# Patient Record
Sex: Male | Born: 1940
Health system: Southern US, Community
[De-identification: ages and names within clinical notes are randomized; demographics above are authoritative.]

## PROBLEM LIST (undated history)

## (undated) ENCOUNTER — Emergency Department (HOSPITAL_BASED_OUTPATIENT_CLINIC_OR_DEPARTMENT_OTHER): Admission: EM | Payer: Medicare Other | Source: Home / Self Care

## (undated) DIAGNOSIS — F1010 Alcohol abuse, uncomplicated: Secondary | ICD-10-CM

## (undated) DIAGNOSIS — F329 Major depressive disorder, single episode, unspecified: Secondary | ICD-10-CM

## (undated) DIAGNOSIS — F32A Depression, unspecified: Secondary | ICD-10-CM

## (undated) DIAGNOSIS — M1990 Unspecified osteoarthritis, unspecified site: Secondary | ICD-10-CM

## (undated) DIAGNOSIS — E785 Hyperlipidemia, unspecified: Secondary | ICD-10-CM

## (undated) DIAGNOSIS — I10 Essential (primary) hypertension: Secondary | ICD-10-CM

## (undated) DIAGNOSIS — N200 Calculus of kidney: Secondary | ICD-10-CM

## (undated) HISTORY — PX: HERNIA REPAIR: SHX51

## (undated) HISTORY — DX: Depression, unspecified: F32.A

## (undated) HISTORY — DX: Alcohol abuse, uncomplicated: F10.10

## (undated) HISTORY — DX: Major depressive disorder, single episode, unspecified: F32.9

## (undated) HISTORY — PX: COCHLEAR IMPLANT: SUR684

## (undated) HISTORY — DX: Unspecified osteoarthritis, unspecified site: M19.90

## (undated) HISTORY — DX: Essential (primary) hypertension: I10

## (undated) HISTORY — DX: Hyperlipidemia, unspecified: E78.5

## (undated) HISTORY — DX: Calculus of kidney: N20.0

---

## 1999-12-25 ENCOUNTER — Ambulatory Visit (HOSPITAL_COMMUNITY): Admission: RE | Admit: 1999-12-25 | Discharge: 1999-12-25 | Payer: Self-pay | Admitting: Gastroenterology

## 2000-08-20 ENCOUNTER — Emergency Department (HOSPITAL_COMMUNITY): Admission: EM | Admit: 2000-08-20 | Discharge: 2000-08-20 | Payer: Self-pay | Admitting: Emergency Medicine

## 2004-01-20 ENCOUNTER — Emergency Department (HOSPITAL_COMMUNITY): Admission: EM | Admit: 2004-01-20 | Discharge: 2004-01-21 | Payer: Self-pay | Admitting: Emergency Medicine

## 2006-04-12 ENCOUNTER — Ambulatory Visit (HOSPITAL_COMMUNITY): Admission: RE | Admit: 2006-04-12 | Discharge: 2006-04-12 | Payer: Self-pay | Admitting: General Surgery

## 2009-11-26 ENCOUNTER — Encounter (HOSPITAL_COMMUNITY): Admission: RE | Admit: 2009-11-26 | Discharge: 2009-12-02 | Payer: Self-pay | Admitting: Cardiology

## 2011-04-23 NOTE — Op Note (Signed)
NAME:  Michael Miranda, Michael Miranda NO.:  [id number]   MEDICAL RECORD NO.:  [id number]          PATIENT TYPE:  AMB   LOCATION:  DAY                          FACILITY:  [facility]   PHYSICIAN:  Adolph Pollack, M.D.DATE OF BIRTH:  Dec 02, 1941   DATE OF PROCEDURE:  04/12/2006  DATE OF DISCHARGE:                                 OPERATIVE REPORT   PREOPERATIVE DIAGNOSIS:  Bilateral inguinal hernias.   POSTOPERATIVE DIAGNOSIS:  Bilateral inguinal hernias.   PROCEDURE:  Laparoscopic repair of bilateral inguinal hernias with mesh.   SURGEON:  Adolph Pollack, M.D.   ANESTHESIA:  General.   INDICATIONS:  This 70 year old male known have bilateral inguinal hernias.  He initially saw Dr. Maryagnes Amos, but the patient was felt to be a good candidate  for laparoscopic repair of this.  I saw him and he presents for that today.  We discussed the procedure risks and aftercare preoperatively.   TECHNIQUE:  He was seen in holding area then voided.  He was brought to the  operating room, placed on the operating table and a general anesthetic was  administered.  The hair in the lower abdominal wall was clipped and the area  sterilely prepped and draped.  Dilute Marcaine solution was infiltrated in  the subumbilical region and a subumbilical incision was made through the  skin and subcutaneous tissue.  The left anterior rectus sheath was  identified.  The left-sided hernia was larger.  I made a small incision into  the left anterior rectus sheath and then exposed the posterior rectus  sheath.  A balloon dissection device was then placed in the extraperitoneal  space under laparoscopic vision.  Balloon dissection performed of the  extraperitoneal space.   Following this, the laparoscope was introduced and CO2 gas insufflated into  the extraperitoneal space.  There was a pneumoperitoneum noted likely from a  small tear in the peritoneum and a Veress needle was placed through the  subumbilical  incision into the peritoneal cavity.  The patient was placed in  Trendelenburg position.  Using blunt dissection, I identify the symphysis  pubis and Cooper's ligament bilaterally.  I then began on the left-sided and  there was a very large left indirect hernia sac noted.  I dissected  fibrofatty tissue away from the anterior abdominal wall anteriorly and  laterally using blunt dissection back to the level of the umbilicus.  I then  began manipulating the hernia sac and dissecting it free from the cord,  which took some time as this was a large sac.  There were some small holes  made in the sac but I was able to strip it back to the level of the  umbilicus.  The spermatic cord contents were identified and a window created  around them.   Following this, I approached the right extraperitoneal space.  I dissected  the fibrofatty tissue away from the anterior and lateral abdominal walls  bluntly.  He also had a moderate to large size indirect sac on this side and  I dissected this free from the spermatic cord back to the level of  the  umbilicus.  The direct spaces was both intact on both sizes as were the  femoral spaces.   Following this, a 5 x 6 inch piece of polypropylene mesh with a partial  longitudinal slit cut into it was then placed into left extraperitoneal  space and positioned so the two tails wrapped around the cord.  The mesh was  then anchored to Cooper's ligament, the anterior and lateral abdominal walls  with spiral tacks.  This more than adequately covered the direct, indirect  and femoral spaces with adequate overlap.   A similar stick piece of mesh measuring 5 x 6 inches long with partial  longitudinal slit cut into it was then placed in the right extraperitoneal  space with the two tails wrapped around the spermatic cord.  The mesh was  then anchored to Cooper's ligament, anterior and lateral abdominal walls  with spiral tacks.  This provided for more than adequate  coverage of overlap  of the direct, indirect and femoral spaces.   At this point, hemostasis was adequate.  I then held down the inferolateral  aspect of each of the meshes and released the CO2 gas and placed the patient  back in the neutral position.  The instruments and trocars were then  removed.   The left anterior rectus sheath fascial defect was closed with 0 Vicryl  suture.  The skin incisions were closed with 4-0 Monocryl subcuticular  stitches followed by Steri-Strips and sterile dressings.   He tolerated the procedure without any apparent complications and was taken  to recovery in satisfactory condition.      Adolph Pollack, M.D.  Electronically Signed     TJR/MEDQ  D:  04/12/2006  T:  04/13/2006  Job:  [id number]   cc:   Jethro Bastos, M.D.  Fax: [phone]

## 2011-12-27 DIAGNOSIS — G4700 Insomnia, unspecified: Secondary | ICD-10-CM | POA: Insufficient documentation

## 2011-12-27 DIAGNOSIS — E7800 Pure hypercholesterolemia, unspecified: Secondary | ICD-10-CM | POA: Insufficient documentation

## 2012-04-08 DIAGNOSIS — H903 Sensorineural hearing loss, bilateral: Secondary | ICD-10-CM | POA: Diagnosis not present

## 2012-07-10 DIAGNOSIS — H905 Unspecified sensorineural hearing loss: Secondary | ICD-10-CM | POA: Diagnosis not present

## 2012-07-10 DIAGNOSIS — Z461 Encounter for fitting and adjustment of hearing aid: Secondary | ICD-10-CM | POA: Diagnosis not present

## 2012-07-21 DIAGNOSIS — H903 Sensorineural hearing loss, bilateral: Secondary | ICD-10-CM | POA: Diagnosis not present

## 2012-08-11 ENCOUNTER — Ambulatory Visit (INDEPENDENT_AMBULATORY_CARE_PROVIDER_SITE_OTHER): Payer: Medicare Other | Admitting: Family

## 2012-08-11 ENCOUNTER — Encounter: Payer: Self-pay | Admitting: Family

## 2012-08-11 VITALS — BP 160/90 | HR 63 | Temp 98.0°F | Ht 68.0 in | Wt 139.0 lb

## 2012-08-11 DIAGNOSIS — R634 Abnormal weight loss: Secondary | ICD-10-CM

## 2012-08-11 DIAGNOSIS — G4700 Insomnia, unspecified: Secondary | ICD-10-CM | POA: Diagnosis not present

## 2012-08-11 DIAGNOSIS — R5381 Other malaise: Secondary | ICD-10-CM

## 2012-08-11 DIAGNOSIS — I10 Essential (primary) hypertension: Secondary | ICD-10-CM | POA: Diagnosis not present

## 2012-08-11 DIAGNOSIS — E785 Hyperlipidemia, unspecified: Secondary | ICD-10-CM

## 2012-08-11 DIAGNOSIS — R5383 Other fatigue: Secondary | ICD-10-CM

## 2012-08-11 LAB — TESTOSTERONE: Testosterone: 372.4 ng/dL (ref 350.00–890.00)

## 2012-08-11 LAB — CBC WITH DIFFERENTIAL/PLATELET
Basophils Relative: 0.4 % (ref 0.0–3.0)
HCT: 45.3 % (ref 39.0–52.0)
Lymphocytes Relative: 15.7 % (ref 12.0–46.0)
Neutro Abs: 4.2 10*3/uL (ref 1.4–7.7)
RBC: 4.51 Mil/uL (ref 4.22–5.81)

## 2012-08-11 LAB — TSH: TSH: 0.63 u[IU]/mL (ref 0.35–5.50)

## 2012-08-11 MED ORDER — ZOLPIDEM TARTRATE 10 MG PO TABS: 10.0000 mg | ORAL_TABLET | Freq: Every evening | ORAL | Status: DC | PRN

## 2012-08-11 NOTE — Progress Notes (Signed)
Subjective:    Patient ID: Michael Miranda, male    DOB: Jan 04, 1941, 71 y.o.   MRN: [id number]  HPI 71 year old new patient is in today to be established. He has a history of Hypertension, hyperlipidemia, and insomnia. Reports recent weigh loss 10lbs in the last 4 months and stress related to his wife recently being diagnosed with breast cancer. She is undergoing chemo. His last CPX was 3 years ago.    Review of Systems  Constitutional: Positive for appetite change and fatigue.  HENT: Negative.   Eyes: Negative.   Respiratory: Negative.   Cardiovascular: Negative.   Gastrointestinal: Negative.  Negative for blood in stool.  Genitourinary: Negative.   Musculoskeletal: Negative.   Skin: Negative.   Neurological: Negative.   Hematological: Negative.   Psychiatric/Behavioral: Positive for disturbed wake/sleep cycle. Negative for suicidal ideas and agitation. The patient is nervous/anxious.    Past Medical History  Diagnosis Date  . Hyperlipidemia   . Hypertension   . Depression   . Alcohol abuse   . Kidney stones   . Arthritis     History   Social History  . Marital Status: Married    Spouse Name: N/A    Number of Children: N/A  . Years of Education: N/A   Occupational History  . Not on file.   Social History Main Topics  . Smoking status: Never Smoker   . Smokeless tobacco: Not on file  . Alcohol Use: Yes  . Drug Use: No  . Sexually Active: Not on file   Other Topics Concern  . Not on file   Social History Narrative  . No narrative on file    Past Surgical History  Procedure Date  . Hernia repair   . Cochlear implant     bilateral    No family history on file.  No Known Allergies  Current Outpatient Prescriptions on File Prior to Visit  Medication Sig Dispense Refill  . atenolol (TENORMIN) 50 MG tablet Take 50 mg by mouth daily.       . benazepril-hydrochlorthiazide (LOTENSIN HCT) 20-12.5 MG per tablet Take 1 tablet by mouth daily.       Marland Kitchen  zolpidem (AMBIEN) 10 MG tablet Take 1 tablet (10 mg total) by mouth at bedtime as needed.  30 tablet  3    BP 160/90  Pulse 63  Temp 98 F (36.7 C) (Oral)  Ht 5\' 8"  (1.727 m)  Wt 139 lb (63.05 kg)  BMI 21.13 kg/m2  SpO2 98%chart    Objective:   Physical Exam  Constitutional: He is oriented to person, place, and time. He appears well-developed and well-nourished.  HENT:  Right Ear: External ear normal.  Left Ear: External ear normal.  Nose: Nose normal.  Mouth/Throat: Oropharynx is clear and moist.  Neck: Normal range of motion. Neck supple.  Cardiovascular: Normal rate, regular rhythm and normal heart sounds.   Pulmonary/Chest: Effort normal and breath sounds normal.  Abdominal: Soft. Bowel sounds are normal.  Musculoskeletal: Normal range of motion.  Neurological: He is alert and oriented to person, place, and time.  Skin: Skin is warm and dry.  Psychiatric: He has a normal mood and affect.          Assessment & Plan:  Assessment: Weight Loss, Acute Stress Reaction, Hypertension, Insomnia, Hyperlipidemia  Plan: Labs sent to include BMP, Lipids, CBC, TSH, testosterone level, will notify patient pending results. Encouraged a healthy diet and exercise. Start cholesterol medication if no better. Will  consider Livalo. Recheck for CPX in 1 month. Ambien renewed. Declined flu shot. Wishes to wait until next month.

## 2012-08-14 LAB — BASIC METABOLIC PANEL
CO2: 23 mEq/L (ref 19–32)
Calcium: 9.8 mg/dL (ref 8.4–10.5)
Creatinine, Ser: 1.1 mg/dL (ref 0.4–1.5)
GFR: 71.67 mL/min (ref >60.00)
Glucose, Bld: 95 mg/dL (ref 70–99)
Sodium: 140 mEq/L (ref 135–145)

## 2012-08-14 LAB — LIPID PANEL
Cholesterol: 237 mg/dL — ABNORMAL HIGH (ref 0–200)
HDL: 84.6 mg/dL (ref >39.00)
Total CHOL/HDL Ratio: 3
Triglycerides: 138 mg/dL (ref 0.0–149.0)
VLDL: 27.6 mg/dL (ref 0.0–40.0)

## 2012-09-06 ENCOUNTER — Encounter: Payer: Federal, State, Local not specified - PPO | Admitting: Family

## 2012-09-18 ENCOUNTER — Ambulatory Visit (INDEPENDENT_AMBULATORY_CARE_PROVIDER_SITE_OTHER): Payer: Medicare Other | Admitting: Family

## 2012-09-18 ENCOUNTER — Encounter: Payer: Self-pay | Admitting: Family

## 2012-09-18 VITALS — BP 130/78 | HR 59 | Temp 97.9°F | Ht 68.0 in | Wt 134.0 lb

## 2012-09-18 DIAGNOSIS — Z0000 Encounter for general adult medical examination without abnormal findings: Secondary | ICD-10-CM

## 2012-09-18 DIAGNOSIS — I10 Essential (primary) hypertension: Secondary | ICD-10-CM | POA: Diagnosis not present

## 2012-09-18 DIAGNOSIS — G4700 Insomnia, unspecified: Secondary | ICD-10-CM | POA: Diagnosis not present

## 2012-09-18 DIAGNOSIS — Z23 Encounter for immunization: Secondary | ICD-10-CM

## 2012-09-18 DIAGNOSIS — E785 Hyperlipidemia, unspecified: Secondary | ICD-10-CM | POA: Diagnosis not present

## 2012-09-18 MED ORDER — TRAZODONE HCL 50 MG PO TABS: 25.0000 mg | ORAL_TABLET | Freq: Every evening | ORAL | Status: DC | PRN

## 2012-09-18 MED ORDER — ATENOLOL 25 MG PO TABS: 25.0000 mg | ORAL_TABLET | Freq: Every day | ORAL | Status: DC

## 2012-09-18 MED ORDER — PITAVASTATIN CALCIUM 2 MG PO TABS: 1.0000 | ORAL_TABLET | Freq: Every day | ORAL | Status: DC

## 2012-09-18 NOTE — Progress Notes (Signed)
Subjective:    Patient ID: Michael Miranda, male    DOB: 07/31/1941, 71 y.o.   MRN: [id number]  HPI 71 year old non smoking white male seen for medicare exam. Pt reports feeling fatigued and having cold hand and feet in warm environments. Pt concerned that atenolol 50mg  is causing symptoms. Pt has high stress level at home due to wife treatment of breast cancer. Pt reported taking wife trazadone 50mg  at night for sleeping 3-4 nights per week with good result. Pt states that on the nights he took trazadone he did not take prescribed Ambien.  Last colonoscopy in 2010.     Review of Systems  Constitutional: Positive for fatigue (Reports feeling tired mid day and having to take a nap ).  HENT: Positive for hearing loss (Cochlear implants bilaterally).   Eyes: Negative.   Respiratory: Negative.   Cardiovascular: Negative.   Gastrointestinal: Negative.   Genitourinary: Negative.   Musculoskeletal: Negative.   Skin: Negative.   Neurological: Negative.   Hematological: Negative.   Psychiatric/Behavioral: Negative.    Past Medical History  Diagnosis Date  . Hyperlipidemia   . Hypertension   . Depression   . Alcohol abuse   . Kidney stones   . Arthritis     History   Social History  . Marital Status: Married    Spouse Name: N/A    Number of Children: N/A  . Years of Education: N/A   Occupational History  . Not on file.   Social History Main Topics  . Smoking status: Never Smoker   . Smokeless tobacco: Not on file  . Alcohol Use: Yes  . Drug Use: No  . Sexually Active: Not on file   Other Topics Concern  . Not on file   Social History Narrative  . No narrative on file    Past Surgical History  Procedure Date  . Hernia repair   . Cochlear implant     bilateral    No family history on file.  No Known Allergies  Current Outpatient Prescriptions on File Prior to Visit  Medication Sig Dispense Refill  . benazepril-hydrochlorthiazide (LOTENSIN HCT) 20-12.5 MG  per tablet Take 1 tablet by mouth daily.       Marland Kitchen zolpidem (AMBIEN) 10 MG tablet Take 1 tablet (10 mg total) by mouth at bedtime as needed.  30 tablet  3  . DISCONTD: atenolol (TENORMIN) 50 MG tablet Take 50 mg by mouth daily.       . Pitavastatin Calcium (LIVALO) 2 MG TABS Take 1 tablet (2 mg total) by mouth at bedtime.  30 tablet  3  . traZODone (DESYREL) 50 MG tablet Take 0.5-1 tablets (25-50 mg total) by mouth at bedtime as needed for sleep.  30 tablet  3    BP 130/78  Pulse 59  Temp 97.9 F (36.6 C) (Oral)  Ht 5\' 8"  (1.727 m)  Wt 134 lb (60.782 kg)  BMI 20.37 kg/m2  SpO2 99%chart    Objective:   Physical Exam  Constitutional: He is oriented to person, place, and time. He appears well-nourished.  HENT:  Head: Normocephalic.  Right Ear: External ear normal.  Left Ear: External ear normal.       Bilaterally cochlear implants  Eyes: Pupils are equal, round, and reactive to light.  Neck: Normal range of motion. Neck supple.  Cardiovascular: Normal rate, regular rhythm and normal heart sounds.   Pulmonary/Chest: Effort normal and breath sounds normal.  Abdominal: Soft. Bowel sounds are normal.  Genitourinary: Rectum normal and penis normal.  Musculoskeletal: Normal range of motion.  Neurological: He is alert and oriented to person, place, and time.  Skin: Skin is warm and dry.  Psychiatric:       anxious     EKG- Bradycardia otherwise Normal Fecal Occult- normal Labs reviewed        Assessment & Plan:  Assessment: Preventative Healthcare, Hypertension, Insomnia, Hypercholesterolmia   PLAN: Atenolol 25mg  daily Livalo 2mg  daily  Trazadone 50 mg daily at night before sleeping Influenza vaccine Next Appointment 6 weeks for Cholesetrol recheck or follow up as needed before appt.

## 2012-09-18 NOTE — Patient Instructions (Signed)

## 2012-09-27 DIAGNOSIS — Z9689 Presence of other specified functional implants: Secondary | ICD-10-CM | POA: Diagnosis not present

## 2012-09-27 DIAGNOSIS — Z462 Encounter for fitting and adjustment of other devices related to nervous system and special senses: Secondary | ICD-10-CM | POA: Diagnosis not present

## 2012-09-27 DIAGNOSIS — H905 Unspecified sensorineural hearing loss: Secondary | ICD-10-CM | POA: Diagnosis not present

## 2012-12-04 DIAGNOSIS — H21509 Unspecified adhesions of iris and ciliary body, unspecified eye: Secondary | ICD-10-CM | POA: Diagnosis not present

## 2012-12-04 DIAGNOSIS — H2510 Age-related nuclear cataract, unspecified eye: Secondary | ICD-10-CM | POA: Diagnosis not present

## 2012-12-04 DIAGNOSIS — D3190 Benign neoplasm of unspecified part of unspecified eye: Secondary | ICD-10-CM | POA: Diagnosis not present

## 2012-12-04 DIAGNOSIS — H43819 Vitreous degeneration, unspecified eye: Secondary | ICD-10-CM | POA: Diagnosis not present

## 2012-12-14 ENCOUNTER — Telehealth: Payer: Self-pay | Admitting: Family

## 2012-12-14 MED ORDER — BENAZEPRIL-HYDROCHLOROTHIAZIDE 20-12.5 MG PO TABS: 1.0000 | ORAL_TABLET | Freq: Every day | ORAL | Status: DC

## 2012-12-14 NOTE — Telephone Encounter (Signed)
Patient came in stating that he need a refill of his benazeprile/hctz 20-12.5mg  sent to [facility] on [address]. Please assist.

## 2012-12-14 NOTE — Telephone Encounter (Signed)
Rx sent 

## 2012-12-25 ENCOUNTER — Ambulatory Visit: Payer: Federal, State, Local not specified - PPO | Admitting: Family

## 2012-12-26 ENCOUNTER — Ambulatory Visit (INDEPENDENT_AMBULATORY_CARE_PROVIDER_SITE_OTHER): Payer: Medicare Other | Admitting: Family

## 2012-12-26 ENCOUNTER — Encounter: Payer: Self-pay | Admitting: Family

## 2012-12-26 VITALS — BP 126/70 | HR 63 | Wt 139.0 lb

## 2012-12-26 DIAGNOSIS — R0789 Other chest pain: Secondary | ICD-10-CM

## 2012-12-26 DIAGNOSIS — I10 Essential (primary) hypertension: Secondary | ICD-10-CM

## 2012-12-26 DIAGNOSIS — G4700 Insomnia, unspecified: Secondary | ICD-10-CM

## 2012-12-26 DIAGNOSIS — E7800 Pure hypercholesterolemia, unspecified: Secondary | ICD-10-CM | POA: Diagnosis not present

## 2012-12-26 MED ORDER — BENAZEPRIL-HYDROCHLOROTHIAZIDE 20-12.5 MG PO TABS: 1.0000 | ORAL_TABLET | Freq: Every day | ORAL | Status: DC

## 2012-12-26 MED ORDER — ZOLPIDEM TARTRATE 10 MG PO TABS: 10.0000 mg | ORAL_TABLET | Freq: Every evening | ORAL | Status: DC | PRN

## 2012-12-26 MED ORDER — ESCITALOPRAM OXALATE 10 MG PO TABS: 10.0000 mg | ORAL_TABLET | Freq: Every day | ORAL | Status: DC

## 2012-12-26 NOTE — Patient Instructions (Signed)

## 2012-12-26 NOTE — Progress Notes (Signed)
Subjective:    Patient ID: Michael Miranda, male    DOB: Apr 17, 1941, 72 y.o.   MRN: [id number]  HPI 72 year old white male, nonsmoker is in for recheck of hypertension, hyperlipidemia, insomnia, depression. He has been off his cholesterol medication for several days because it was too expensive. He previously had a status card that was helping with co-pay. Patient's wife is still battling breast cancer but appears to be doing well. Patient reports being under a lot of stress with multiple doctor's appointments in with his wife's illness. Reports have been a dull chest ache that occurs a couple times a week lasting 1-2 hours. It particularly when he's having to deal with his wife's  "issues." No pain with exertion. He had an echocardiogram 3 years ago that was normal. Rates her pain a 4/10 but eventually just subsides. Denies any lightheadedness, dizziness, shortness of breath, nausea or diaphoresis.  Patient reports have been more sadness recently. Feels like he doesn't have a life. Reports 2-3 days a week of feeling down. Denies anxiousness. Denies any feelings of helplessness, hopelessness, thoughts of death or dying.  Review of Systems  Constitutional: Negative.   Respiratory: Negative.   Cardiovascular: Negative.   Gastrointestinal: Negative.   Musculoskeletal: Negative.   Skin: Negative.   Hematological: Negative.   Psychiatric/Behavioral: Negative.    Past Medical History  Diagnosis Date  . Hyperlipidemia   . Hypertension   . Depression   . Alcohol abuse   . Kidney stones   . Arthritis     History   Social History  . Marital Status: Married    Spouse Name: N/A    Number of Children: N/A  . Years of Education: N/A   Occupational History  . Not on file.   Social History Main Topics  . Smoking status: Never Smoker   . Smokeless tobacco: Not on file  . Alcohol Use: Yes  . Drug Use: No  . Sexually Active: Not on file   Other Topics Concern  . Not on file   Social  History Narrative  . No narrative on file    Past Surgical History  Procedure Date  . Hernia repair   . Cochlear implant     bilateral    No family history on file.  No Known Allergies  Current Outpatient Prescriptions on File Prior to Visit  Medication Sig Dispense Refill  . atenolol (TENORMIN) 25 MG tablet Take 1 tablet (25 mg total) by mouth daily.  30 tablet  3  . benazepril-hydrochlorthiazide (LOTENSIN HCT) 20-12.5 MG per tablet Take 1 tablet by mouth daily.  90 tablet  1  . Pitavastatin Calcium (LIVALO) 2 MG TABS Take 1 tablet (2 mg total) by mouth at bedtime.  30 tablet  3  . escitalopram (LEXAPRO) 10 MG tablet Take 1 tablet (10 mg total) by mouth daily.  30 tablet  3    BP 126/70  Pulse 63  Wt 139 lb (63.05 kg)  SpO2 99%chart    Objective:   Physical Exam  Constitutional: He is oriented to person, place, and time. He appears well-developed and well-nourished.  Neck: Normal range of motion. Neck supple.  Cardiovascular: Normal rate, regular rhythm and normal heart sounds.   Pulmonary/Chest: Effort normal and breath sounds normal.  Abdominal: Soft. Bowel sounds are normal.  Musculoskeletal: Normal range of motion.  Neurological: He is alert and oriented to person, place, and time.  Skin: Skin is warm and dry.  Psychiatric: He has a normal mood  and affect.          Assessment & Plan:  Assessment:  1.Hypercholesterolemia 2. Hypertension 3. Depression 4. Insomnia 5. Chest discomfort  Plan: Continue Livalo. New savings card given today. Refer to cardiology for management of chest discomfort and possible stress test. DC trazodone and start Lexapro 10 mg once daily. His chest pain is most likely anxiety induced. However, were clear him from a cardiology standpoint. Since patient has been off of his cholesterol medicine several days, we'll bring him back for a lab only appointment to obtain lipids. Office visit in 6 weeks and sooner as needed.

## 2013-01-08 ENCOUNTER — Encounter: Payer: Self-pay | Admitting: Cardiovascular Disease

## 2013-01-08 ENCOUNTER — Ambulatory Visit (INDEPENDENT_AMBULATORY_CARE_PROVIDER_SITE_OTHER): Payer: Medicare Other | Admitting: Cardiovascular Disease

## 2013-01-08 VITALS — BP 112/64 | HR 57 | Ht 69.0 in | Wt 135.0 lb

## 2013-01-08 DIAGNOSIS — R072 Precordial pain: Secondary | ICD-10-CM

## 2013-01-08 NOTE — Patient Instructions (Addendum)
Your physician has requested that you have an exercise stress myoview. For further information please visit [email or web address] Please follow instruction sheet, as given.  Your physician recommends that you schedule a follow-up appointment as needed.   Your physician has recommended you make the following change in your medication: START Aspirin 81mg  take one by mouth daily

## 2013-01-08 NOTE — Progress Notes (Signed)
HPI:   72 year old gentleman presenting for initial cardiac evaluation. The patient has developed chest pressure on an episodic basis over the last few months. He's been under a lot of stress related to his wife's breast cancer illness. She's had to undergo daily radiation therapy. Over the last few months he complains of pressure over the substernal chest region, nonradiating to the neck or shoulders. Symptoms typically occur at times of high stress or in association with physical exertion. When he does a brisk walk he has mild symptoms. He is able to continue walking and symptoms resolved. Episodes have always been self-limited and it never lasted longer than an hour. He denies shortness of breath, palpitations, orthopnea, or PND. He has no past history of cardiac disease.  Outpatient Encounter Prescriptions as of 01/08/2013  Medication Sig Dispense Refill  . atenolol (TENORMIN) 25 MG tablet Take 1 tablet (25 mg total) by mouth daily.  30 tablet  3  . benazepril-hydrochlorthiazide (LOTENSIN HCT) 20-12.5 MG per tablet Take 1 tablet by mouth daily.  90 tablet  1  . escitalopram (LEXAPRO) 10 MG tablet Take 1 tablet (10 mg total) by mouth daily.  30 tablet  3  . Pitavastatin Calcium (LIVALO) 2 MG TABS Take 1 tablet (2 mg total) by mouth at bedtime.  30 tablet  3  . traZODone (DESYREL) 50 MG tablet 50 mg at bedtime.       Marland Kitchen zolpidem (AMBIEN) 10 MG tablet Take 1 tablet (10 mg total) by mouth at bedtime as needed.  30 tablet  3    Review of patient's allergies indicates no known allergies.  Past Medical History  Diagnosis Date  . Hyperlipidemia   . Hypertension   . Depression   . Alcohol abuse   . Kidney stones   . Arthritis     Past Surgical History  Procedure Date  . Hernia repair   . Cochlear implant     bilateral    History   Social History  . Marital Status: Married    Spouse Name: N/A    Number of Children: N/A  . Years of Education: N/A   Occupational History  . Not on  file.   Social History Main Topics  . Smoking status: Never Smoker   . Smokeless tobacco: Not on file  . Alcohol Use: Yes  . Drug Use: No  . Sexually Active: Not on file   Other Topics Concern  . Not on file   Social History Narrative  . No narrative on file   Family history: Brother died at age 22 of a myocardial infarction. Mother died at age 32 of "atherosclerotic heart disease." Father died age 2 of lung cancer.  ROS:  General: no fevers/chills/night sweats. Positive for generalized fatigue Eyes: no blurry vision, diplopia, or amaurosis ENT: no sore throat or hearing loss Resp: no cough, wheezing, or hemoptysis CV: no edema or palpitations GI: no abdominal pain, nausea, vomiting, diarrhea, or constipation GU: no dysuria, frequency, or hematuria Skin: no rash Neuro: no headache, numbness, tingling, or weakness of extremities Musculoskeletal: no joint pain or swelling Heme: no bleeding, DVT, or easy bruising Endo: no polydipsia or polyuria  BP 112/64  Pulse 57  Ht 5\' 9"  (1.753 m)  Wt 61.236 kg (135 lb)  BMI 19.94 kg/m2  PHYSICAL EXAM: Pt is alert and oriented, WD, WN, pleasant thin male in no distress. HEENT: normal Neck: JVP normal. Carotid upstrokes normal without bruits. No thyromegaly. Lungs: equal expansion, clear bilaterally CV:  Apex is discrete and nondisplaced, RRR without murmur or gallop Abd: soft, NT, +BS, no bruit, no hepatosplenomegaly Back: no CVA tenderness Ext: no C/C/E        Femoral pulses 2+= without bruits        DP/PT pulses intact and = Skin: warm and dry without rash Neuro: CNII-XII intact             Strength intact = bilaterally  EKG:  Sinus bradycardia 57 beats per minute, age-indeterminate anteroseptal infarction, otherwise within normal limits.  ASSESSMENT AND PLAN: 1. Chest pain with typical features. It's possible that his symptoms are related to stress and anxiety, but concerning that there is also an association with physical  activity. I have recommended that he undergo an exercise Myoview stress test to evaluate for significant cardiac ischemia. I also recommended that he start aspirin 81 mg daily. He will otherwise continue on his current medical program. I'll see him back in followup if there are any significant abnormalities on his stress test, otherwise he should continue with  His current medications.  2. Hyperlipidemia. Most recent lipid showed a cholesterol of 237, HDL 85, and LDL 170. He's tolerating Livalo and will continue this. We discussed controversies in cholesterol management with respect to primary prevention of CAD, but considering his family history it seems reasonable to continue this.  3. HTN - continue same medications.  For follow-up I'll see him back if there are abnormalities on his stress test, and otherwise will see him on an as needed basis.  Tonny Bollman 01/08/2013 3:25 PM

## 2013-01-23 ENCOUNTER — Other Ambulatory Visit (INDEPENDENT_AMBULATORY_CARE_PROVIDER_SITE_OTHER): Payer: Medicare Other

## 2013-01-23 ENCOUNTER — Telehealth: Payer: Self-pay | Admitting: Family

## 2013-01-23 DIAGNOSIS — G4700 Insomnia, unspecified: Secondary | ICD-10-CM

## 2013-01-23 DIAGNOSIS — E7800 Pure hypercholesterolemia, unspecified: Secondary | ICD-10-CM | POA: Diagnosis not present

## 2013-01-23 DIAGNOSIS — Z125 Encounter for screening for malignant neoplasm of prostate: Secondary | ICD-10-CM

## 2013-01-23 DIAGNOSIS — I10 Essential (primary) hypertension: Secondary | ICD-10-CM | POA: Diagnosis not present

## 2013-01-23 LAB — LIPID PANEL
HDL: 75.6 mg/dL (ref >39.00)
LDL Cholesterol: 74 mg/dL (ref 0–99)
Total CHOL/HDL Ratio: 2
Triglycerides: 48 mg/dL (ref 0.0–149.0)

## 2013-01-23 LAB — BASIC METABOLIC PANEL
BUN: 16 mg/dL (ref 6–23)
CO2: 28 mEq/L (ref 19–32)
Calcium: 9.2 mg/dL (ref 8.4–10.5)
Chloride: 102 mEq/L (ref 96–112)
GFR: 67.94 mL/min (ref >60.00)
Sodium: 138 mEq/L (ref 135–145)

## 2013-01-23 LAB — HEPATIC FUNCTION PANEL
AST: 27 U/L (ref 0–37)
Alkaline Phosphatase: 66 U/L (ref 39–117)
Bilirubin, Direct: 0.2 mg/dL (ref 0.0–0.3)

## 2013-01-23 MED ORDER — BENAZEPRIL-HYDROCHLOROTHIAZIDE 20-12.5 MG PO TABS: 1.0000 | ORAL_TABLET | Freq: Every day | ORAL | Status: DC

## 2013-01-23 NOTE — Telephone Encounter (Signed)
Patient came in stating that he need a refill of his benazepril/hctz 20-12.5 mg 1poqd sent to [facility] on [address]. Please assist.

## 2013-01-30 ENCOUNTER — Ambulatory Visit (HOSPITAL_COMMUNITY): Payer: Medicare Other | Attending: Cardiology | Admitting: Radiology

## 2013-01-30 VITALS — BP 145/80 | Ht 69.0 in | Wt 132.0 lb

## 2013-01-30 DIAGNOSIS — E785 Hyperlipidemia, unspecified: Secondary | ICD-10-CM | POA: Insufficient documentation

## 2013-01-30 DIAGNOSIS — R0789 Other chest pain: Secondary | ICD-10-CM | POA: Insufficient documentation

## 2013-01-30 DIAGNOSIS — R072 Precordial pain: Secondary | ICD-10-CM

## 2013-01-30 DIAGNOSIS — R42 Dizziness and giddiness: Secondary | ICD-10-CM | POA: Diagnosis not present

## 2013-01-30 DIAGNOSIS — I498 Other specified cardiac arrhythmias: Secondary | ICD-10-CM | POA: Insufficient documentation

## 2013-01-30 DIAGNOSIS — Z8249 Family history of ischemic heart disease and other diseases of the circulatory system: Secondary | ICD-10-CM | POA: Insufficient documentation

## 2013-01-30 DIAGNOSIS — I252 Old myocardial infarction: Secondary | ICD-10-CM | POA: Diagnosis not present

## 2013-01-30 DIAGNOSIS — R079 Chest pain, unspecified: Secondary | ICD-10-CM

## 2013-01-30 DIAGNOSIS — R5381 Other malaise: Secondary | ICD-10-CM | POA: Insufficient documentation

## 2013-01-30 DIAGNOSIS — I10 Essential (primary) hypertension: Secondary | ICD-10-CM

## 2013-01-30 DIAGNOSIS — E7800 Pure hypercholesterolemia, unspecified: Secondary | ICD-10-CM

## 2013-01-30 MED ORDER — TECHNETIUM TC 99M SESTAMIBI GENERIC - CARDIOLITE
10.0000 | Freq: Once | INTRAVENOUS | Status: AC | PRN
  Administered 2013-01-30: 10 via INTRAVENOUS

## 2013-01-30 MED ORDER — TECHNETIUM TC 99M SESTAMIBI GENERIC - CARDIOLITE
30.0000 | Freq: Once | INTRAVENOUS | Status: AC | PRN
  Administered 2013-01-30: 30 via INTRAVENOUS

## 2013-01-30 NOTE — Progress Notes (Signed)
[facility] SITE 3 NUCLEAR MED [address] [place], [place] [address] [phone]    Cardiology Nuclear Med Study  Michael Miranda is a 72 y.o. male     MRN : [id number]     DOB: [birth year]  Procedure Date: 01/30/2013  Nuclear Med Background Indication for Stress Test:  Evaluation for Ischemia History:  12/10 Myocardial Perfusion Study EF=66%(no ischemia) neg at [facility] Cardiac Risk Factors: Family History - CAD, Hypertension and Lipids  Symptoms:  Chest Pressure.  (last date of chest discomfort vague(after walking)), Fatigue and Light-Headedness   Nuclear Pre-Procedure Caffeine/Decaff Intake:  None NPO After: 7:00pm   Lungs:  clear O2 Sat: 99% on room air. IV 0.9% NS with Angio Cath:  22g  IV Site: R Hand  IV Started by:  Doyne Keel, CNMT  Chest Size (in):  37 Cup Size: n/a  Height: 5\' 9"  (1.753 m)  Weight:  132 lb (59.875 kg)  BMI:  Body mass index is 19.48 kg/(m^2). Tech Comments:  Held atenolol    Nuclear Med Study 1 or 2 day study: 1 day  Stress Test Type:  Stress  Reading MD: Olga Millers, MD  Order Authorizing Provider:  Dayle Points, MD  Resting Radionuclide: Technetium 34m Sestamibi  Resting Radionuclide Dose: 11.0 mCi   Stress Radionuclide:  Technetium 57m Sestamibi  Stress Radionuclide Dose: 32.8 mCi           Stress Protocol Rest HR: 53 Stress HR: 130  Rest BP: 145/80 Stress BP: 187/69  Exercise Time (min): 8:30 METS: 10.10   Predicted Max HR: 149 bpm % Max HR: 87.25 bpm Rate Pressure Product: 95284   Dose of Adenosine (mg):  n/a Dose of Lexiscan: n/a mg  Dose of Atropine (mg): n/a Dose of Dobutamine: n/a mcg/kg/min (at max HR)  Stress Test Technologist: Frederick Peers, EMT-P  Nuclear Technologist:  Domenic Polite, CNMT     Rest Procedure:  Myocardial perfusion imaging was performed at rest 45 minutes following the intravenous administration of Technetium 15m Sestamibi. Rest ECG: Sinus bradycardia; septal MI.  Stress Procedure:  The  patient exercised on the treadmill utilizing the Bruce Protocol for 8:30 minutes. The patient stopped due to feeling faint and slight chest discomfort that resolved within 1 minute of recovery .  Technetium 31m Sestamibi was injected at peak exercise and myocardial perfusion imaging was performed after a brief delay. Stress ECG: No significant ST segment change suggestive of ischemia.  QPS Raw Data Images:  There is interference from nuclear activity from structures below the diaphragm. This does not affect the ability to read the study. Stress Images:  There is decreased uptake in the inferior wall. Rest Images:  There is decreased uptake in the inferior wall. Subtraction (SDS):  No evidence of ischemia. Transient Ischemic Dilatation (Normal <1.22):  1.12 Lung/Heart Ratio (Normal <0.45):  0.31  Quantitative Gated Spect Images QGS EDV:  78 ml QGS ESV:  24 ml  Impression Exercise Capacity:  Fair exercise capacity. BP Response:  Normal blood pressure response. Clinical Symptoms:  "Slight chest discomfort" ECG Impression:  No significant ST segment change suggestive of ischemia. Comparison with Prior Nuclear Study: No images to compare  Overall Impression:  Normal stress nuclear study with a small, mild intensity, fixed inferior defect consistent with thinning; no ischemia.  LV Ejection Fraction: 70%.  LV Wall Motion:  NL LV Function; NL Wall Motion  Olga Millers

## 2013-03-26 DIAGNOSIS — Z461 Encounter for fitting and adjustment of hearing aid: Secondary | ICD-10-CM | POA: Diagnosis not present

## 2013-03-26 DIAGNOSIS — H903 Sensorineural hearing loss, bilateral: Secondary | ICD-10-CM | POA: Diagnosis not present

## 2013-05-01 ENCOUNTER — Encounter: Payer: Self-pay | Admitting: Family

## 2013-05-01 ENCOUNTER — Ambulatory Visit (INDEPENDENT_AMBULATORY_CARE_PROVIDER_SITE_OTHER): Payer: Medicare Other | Admitting: Family

## 2013-05-01 VITALS — BP 118/72 | HR 59 | Wt 136.0 lb

## 2013-05-01 DIAGNOSIS — G4700 Insomnia, unspecified: Secondary | ICD-10-CM

## 2013-05-01 DIAGNOSIS — I10 Essential (primary) hypertension: Secondary | ICD-10-CM | POA: Diagnosis not present

## 2013-05-01 DIAGNOSIS — E7800 Pure hypercholesterolemia, unspecified: Secondary | ICD-10-CM | POA: Diagnosis not present

## 2013-05-01 MED ORDER — PITAVASTATIN CALCIUM 2 MG PO TABS: 1.0000 | ORAL_TABLET | Freq: Every day | ORAL | Status: DC

## 2013-05-01 MED ORDER — TRAZODONE HCL 50 MG PO TABS: 50.0000 mg | ORAL_TABLET | Freq: Every day | ORAL | Status: DC

## 2013-05-01 NOTE — Patient Instructions (Addendum)
Sodium-Controlled Diet Sodium is a mineral. It is found in many foods. Sodium may be found naturally or added during the making of a food. The most common form of sodium is salt, which is made up of sodium and chloride. Reducing your sodium intake involves changing your eating habits. The following guidelines will help you reduce the sodium in your diet:  Stop using the salt shaker.  Use salt sparingly in cooking and baking.  Substitute with sodium-free seasonings and spices.  Do not use a salt substitute (potassium chloride) without your caregiver's permission.  Include a variety of fresh, unprocessed foods in your diet.  Limit the use of processed and convenience foods that are high in sodium. USE THE FOLLOWING FOODS SPARINGLY: Breads/Starches  Commercial bread stuffing, commercial pancake or waffle mixes, coating mixes. Waffles. Croutons. Prepared (boxed or frozen) potato, rice, or noodle mixes that contain salt or sodium. Salted French fries or hash browns. Salted popcorn, breads, crackers, chips, or snack foods. Vegetables  Vegetables canned with salt or prepared in cream, butter, or cheese sauces. Sauerkraut. Tomato or vegetable juices canned with salt.  Fresh vegetables are allowed if rinsed thoroughly. Fruit  Fruit is okay to eat. Meat and Meat Substitutes  Salted or smoked meats, such as bacon or Canadian bacon, chipped or corned beef, hot dogs, salt pork, luncheon meats, pastrami, ham, or sausage. Canned or smoked fish, poultry, or meat. Processed cheese or cheese spreads, blue or Roquefort cheese. Battered or frozen fish products. Prepared spaghetti sauce. Baked beans. Reuben sandwiches. Salted nuts. Caviar. Milk  Limit buttermilk to 1 cup per week. Soups and Combination Foods  Bouillon cubes, canned or dried soups, broth, consomm. Convenience (frozen or packaged) dinners with more than 600 mg sodium. Pot pies, pizza, Asian food, fast food cheeseburgers, and specialty  sandwiches. Desserts and Sweets  Regular (salted) desserts, pie, commercial fruit snack pies, commercial snack cakes, canned puddings.  Eat desserts and sweets in moderation. Fats and Oils  Gravy mixes or canned gravy. No more than 1 to 2 tbs of salad dressing. Chip dips.  Eat fats and oils in moderation. Beverages  See those listed under the vegetables and milk groups. Condiments  Ketchup, mustard, meat sauces, salsa, regular (salted) and lite soy sauce or mustard. Dill pickles, olives, meat tenderizer. Prepared horseradish or pickle relish. Dutch-processed cocoa. Baking powder or baking soda used medicinally. Worcestershire sauce. "Light" salt. Salt substitute, unless approved by your caregiver. Document Released: 05/14/2002 Document Revised: 02/14/2012 Document Reviewed: 12/15/2009 Exit[employer] Patient Information 2014 [employer].  

## 2013-05-01 NOTE — Progress Notes (Signed)
Subjective:    Patient ID: Michael Miranda, male    DOB: [birth year], 72 y.o.   MRN: [id number]  HPI 72 year old white male, nonsmoker is in for recheck of hypertension, hyperlipidemia, insomnia, and depression. He reports doing well. He's recently discontinued taking Lexapro and will like to remain off of the medication. He is tolerating all of his other medicines well. Patient continues to report periodic episodes of chest discomfort that are not associated with exercise. He feels a bit more associated with stress. Since his wife has been doing better medically, he's had less of these episodes. He saw cardiology in the past that performed a stress test that was negative. He had an ejection fraction of 70% with no enlargement. Otherwise within normal limits. Denies any feelings of helplessness, hopelessness, thoughts of death or dying.   Review of Systems  Constitutional: Negative.   HENT: Negative.   Respiratory: Negative.   Cardiovascular: Negative.   Gastrointestinal: Negative.   Endocrine: Negative.   Musculoskeletal: Negative.   Skin: Negative.   Neurological: Negative.   Hematological: Negative.   Psychiatric/Behavioral: Negative.    Past Medical History  Diagnosis Date  . Hyperlipidemia   . Hypertension   . Depression   . Alcohol abuse   . Kidney stones   . Arthritis     History   Social History  . Marital Status: Married    Spouse Name: N/A    Number of Children: N/A  . Years of Education: N/A   Occupational History  . Not on file.   Social History Main Topics  . Smoking status: Never Smoker   . Smokeless tobacco: Not on file  . Alcohol Use: Yes  . Drug Use: No  . Sexually Active: Not on file   Other Topics Concern  . Not on file   Social History Narrative  . No narrative on file    Past Surgical History  Procedure Laterality Date  . Hernia repair    . Cochlear implant      bilateral    No family history on file.  No Known Allergies  Current  Outpatient Prescriptions on File Prior to Visit  Medication Sig Dispense Refill  . aspirin EC 81 MG tablet Take 1 tablet (81 mg total) by mouth daily.  1 tablet  0  . benazepril-hydrochlorthiazide (LOTENSIN HCT) 20-12.5 MG per tablet Take 1 tablet by mouth daily.  90 tablet  1  . zolpidem (AMBIEN) 10 MG tablet Take 1 tablet (10 mg total) by mouth at bedtime as needed.  30 tablet  3   No current facility-administered medications on file prior to visit.    BP 118/72  Pulse 59  Wt 136 lb (61.689 kg)  BMI 20.07 kg/m2chart    Objective:   Physical Exam  Constitutional: He is oriented to person, place, and time. He appears well-developed and well-nourished.  HENT:  Right Ear: External ear normal.  Left Ear: External ear normal.  Nose: Nose normal.  Mouth/Throat: Oropharynx is clear and moist.  Neck: Normal range of motion. Neck supple. No thyromegaly present.  Cardiovascular: Normal rate, regular rhythm and normal heart sounds.   Pulmonary/Chest: Effort normal and breath sounds normal.  Abdominal: Bowel sounds are normal.  Musculoskeletal: Normal range of motion.  Neurological: He is alert and oriented to person, place, and time.  Skin: Skin is warm and dry.  Psychiatric: He has a normal mood and affect.          Assessment & Plan:  Assessment:  1. Hypertension 2. Hypercholesterolemia 3. Insomnia  Plan: Low-sodium diet. Patient will monitor blood pressure twice a week and send readings through mychart. We will consider a CCB if his blood pressure begins to rise. If necessary, we'll consider another SSRI is chest discomfort continues or increases in frequency. Encourage daily exercise to help with serotonin levels. We'll followup with patient as discussed for complete physical exam and sooner as needed.

## 2013-06-14 DIAGNOSIS — H903 Sensorineural hearing loss, bilateral: Secondary | ICD-10-CM | POA: Diagnosis not present

## 2013-07-11 ENCOUNTER — Other Ambulatory Visit: Payer: Self-pay

## 2013-07-11 DIAGNOSIS — H903 Sensorineural hearing loss, bilateral: Secondary | ICD-10-CM | POA: Diagnosis not present

## 2013-10-01 ENCOUNTER — Encounter: Payer: Self-pay | Admitting: Family

## 2013-10-01 ENCOUNTER — Ambulatory Visit (INDEPENDENT_AMBULATORY_CARE_PROVIDER_SITE_OTHER): Payer: Medicare Other | Admitting: Family

## 2013-10-01 VITALS — BP 140/78 | HR 62 | Ht 68.0 in | Wt 134.0 lb

## 2013-10-01 DIAGNOSIS — E7800 Pure hypercholesterolemia, unspecified: Secondary | ICD-10-CM

## 2013-10-01 DIAGNOSIS — I10 Essential (primary) hypertension: Secondary | ICD-10-CM

## 2013-10-01 DIAGNOSIS — Z23 Encounter for immunization: Secondary | ICD-10-CM | POA: Diagnosis not present

## 2013-10-01 DIAGNOSIS — Z0000 Encounter for general adult medical examination without abnormal findings: Secondary | ICD-10-CM

## 2013-10-01 DIAGNOSIS — G4700 Insomnia, unspecified: Secondary | ICD-10-CM

## 2013-10-01 DIAGNOSIS — Z125 Encounter for screening for malignant neoplasm of prostate: Secondary | ICD-10-CM

## 2013-10-01 LAB — POCT URINALYSIS DIPSTICK
Bilirubin, UA: NEGATIVE
Blood, UA: NEGATIVE
Glucose, UA: NEGATIVE
Leukocytes, UA: NEGATIVE
Protein, UA: NEGATIVE
pH, UA: 7.5

## 2013-10-01 LAB — LDL CHOLESTEROL, DIRECT: Direct LDL: 92.3 mg/dL

## 2013-10-01 LAB — HEPATIC FUNCTION PANEL
ALT: 18 U/L (ref 0–53)
AST: 26 U/L (ref 0–37)
Albumin: 4.2 g/dL (ref 3.5–5.2)
Bilirubin, Direct: 0.1 mg/dL (ref 0.0–0.3)
Total Bilirubin: 0.8 mg/dL (ref 0.3–1.2)

## 2013-10-01 LAB — BASIC METABOLIC PANEL
Chloride: 99 mEq/L (ref 96–112)
Glucose, Bld: 96 mg/dL (ref 70–99)

## 2013-10-01 LAB — LIPID PANEL: Total CHOL/HDL Ratio: 2

## 2013-10-01 LAB — CBC WITH DIFFERENTIAL/PLATELET
Basophils Absolute: 0 10*3/uL (ref 0.0–0.1)
Basophils Relative: 0.6 % (ref 0.0–3.0)
Eosinophils Relative: 2.6 % (ref 0.0–5.0)
HCT: 43.2 % (ref 39.0–52.0)
Lymphocytes Relative: 16.8 % (ref 12.0–46.0)
Lymphs Abs: 0.7 10*3/uL (ref 0.7–4.0)
MCHC: 34.6 g/dL (ref 30.0–36.0)
MCV: 98.9 fl (ref 78.0–100.0)
Monocytes Absolute: 0.5 10*3/uL (ref 0.1–1.0)
Monocytes Relative: 11.5 % (ref 3.0–12.0)
Neutro Abs: 3 10*3/uL (ref 1.4–7.7)
Neutrophils Relative %: 68.5 % (ref 43.0–77.0)

## 2013-10-01 LAB — TSH: TSH: 0.68 u[IU]/mL (ref 0.35–5.50)

## 2013-10-01 MED ORDER — ZOLPIDEM TARTRATE 10 MG PO TABS: 10.0000 mg | ORAL_TABLET | Freq: Every evening | ORAL | Status: DC | PRN

## 2013-10-01 NOTE — Patient Instructions (Signed)
Exercise to Stay Healthy Exercise helps you become and stay healthy. EXERCISE IDEAS AND TIPS Choose exercises that:  You enjoy.  Fit into your day. You do not need to exercise really hard to be healthy. You can do exercises at a slow or medium level and stay healthy. You can:  Stretch before and after working out.  Try yoga, Pilates, or tai chi.  Lift weights.  Walk fast, swim, jog, run, climb stairs, bicycle, dance, or rollerskate.  Take aerobic classes. Exercises that burn about 150 calories:  Running 1  miles in 15 minutes.  Playing volleyball for 45 to 60 minutes.  Washing and waxing a car for 45 to 60 minutes.  Playing touch football for 45 minutes.  Walking 1  miles in 35 minutes.  Pushing a stroller 1  miles in 30 minutes.  Playing basketball for 30 minutes.  Raking leaves for 30 minutes.  Bicycling 5 miles in 30 minutes.  Walking 2 miles in 30 minutes.  Dancing for 30 minutes.  Shoveling snow for 15 minutes.  Swimming laps for 20 minutes.  Walking up stairs for 15 minutes.  Bicycling 4 miles in 15 minutes.  Gardening for 30 to 45 minutes.  Jumping rope for 15 minutes.  Washing windows or floors for 45 to 60 minutes. Document Released: 12/25/2010 Document Revised: 02/14/2012 Document Reviewed: 12/25/2010 ExitCare Patient Information 2014 [employer].  

## 2013-10-01 NOTE — Progress Notes (Signed)
Subjective:    Patient ID: Michael Miranda, male    DOB: March 13, 1941, 72 y.o.   MRN: [id number]  HPI Patient presents for yearly preventative medicine examination. All immunizations and health maintenance protocols were reviewed with the patient and they are up to date with these protocols. Screening laboratory values were reviewed with the patient including screening of hyperlipidemia PSA renal function and hepatic function. There medications past medical history social history problem list and allergies were reviewed in detail. Goals were established with regard to weight loss exercise diet in compliance with medications   Review of Systems  Constitutional: Negative.   HENT: Negative.   Eyes: Negative.   Respiratory: Negative.   Cardiovascular: Negative.   Gastrointestinal: Negative.   Endocrine: Negative.   Genitourinary: Negative.   Musculoskeletal: Negative.   Skin: Negative.   Allergic/Immunologic: Negative.   Neurological: Negative.   Hematological: Negative.   Psychiatric/Behavioral: Negative.    Past Medical History  Diagnosis Date  . Hyperlipidemia   . Hypertension   . Depression   . Alcohol abuse   . Kidney stones   . Arthritis     History   Social History  . Marital Status: Married    Spouse Name: N/A    Number of Children: N/A  . Years of Education: N/A   Occupational History  . Not on file.   Social History Main Topics  . Smoking status: Never Smoker   . Smokeless tobacco: Not on file  . Alcohol Use: Yes  . Drug Use: No  . Sexual Activity: Not on file   Other Topics Concern  . Not on file   Social History Narrative  . No narrative on file    Past Surgical History  Procedure Laterality Date  . Hernia repair    . Cochlear implant      bilateral    No family history on file.  No Known Allergies  Current Outpatient Prescriptions on File Prior to Visit  Medication Sig Dispense Refill  . aspirin EC 81 MG tablet Take 1 tablet (81 mg  total) by mouth daily.  1 tablet  0  . benazepril-hydrochlorthiazide (LOTENSIN HCT) 20-12.5 MG per tablet Take 1 tablet by mouth daily.  90 tablet  1  . Pitavastatin Calcium (LIVALO) 2 MG TABS Take 1 tablet (2 mg total) by mouth at bedtime.  30 tablet  3  . traZODone (DESYREL) 50 MG tablet Take 1 tablet (50 mg total) by mouth at bedtime.  30 tablet  3   No current facility-administered medications on file prior to visit.    BP 140/78  Pulse 62  Ht 5\' 8"  (1.727 m)  Wt 134 lb (60.782 kg)  BMI 20.38 kg/m2chart    Objective:   Physical Exam  Constitutional: He is oriented to person, place, and time. He appears well-developed and well-nourished.  HENT:  Head: Normocephalic and atraumatic.  Right Ear: External ear normal.  Left Ear: External ear normal.  Mouth/Throat: Oropharynx is clear and moist.  Eyes: Conjunctivae and EOM are normal. Pupils are equal, round, and reactive to light.  Neck: Normal range of motion. Neck supple. No thyromegaly present.  Cardiovascular: Normal rate, regular rhythm and normal heart sounds.   Pulmonary/Chest: Effort normal and breath sounds normal.  Abdominal: Soft. Bowel sounds are normal. He exhibits no distension. There is no tenderness. There is no rebound.  Genitourinary: Rectum normal, prostate normal and penis normal. Guaiac negative stool. No penile tenderness.  Musculoskeletal: Normal range of motion. He  exhibits no edema and no tenderness.  Neurological: He is alert and oriented to person, place, and time. He has normal reflexes. He displays normal reflexes. No cranial nerve deficit. Coordination normal.  Skin: Skin is warm and dry.  Psychiatric: He has a normal mood and affect.          Assessment & Plan:  Assessment: 1. Complete physical exam 2. Hypertension 3 hyperlipidemia 4. Insomnia  Plan: Lab sent to include BMP, CBC, LFTs, lipid, UA will notify patient of results. Encouraged a healthy diet, exercise. We'll follow up patient in 6  months and sooner as needed.

## 2013-10-04 DIAGNOSIS — D3190 Benign neoplasm of unspecified part of unspecified eye: Secondary | ICD-10-CM | POA: Diagnosis not present

## 2013-12-14 ENCOUNTER — Other Ambulatory Visit: Payer: Self-pay | Admitting: Family

## 2014-03-14 ENCOUNTER — Telehealth: Payer: Self-pay | Admitting: Family

## 2014-03-14 MED ORDER — ESCITALOPRAM OXALATE 10 MG PO TABS: ORAL_TABLET | ORAL | Status: DC

## 2014-03-14 MED ORDER — BENAZEPRIL-HYDROCHLOROTHIAZIDE 20-12.5 MG PO TABS: 1.0000 | ORAL_TABLET | Freq: Every day | ORAL | Status: DC

## 2014-03-14 MED ORDER — ZOLPIDEM TARTRATE 10 MG PO TABS: 10.0000 mg | ORAL_TABLET | Freq: Every evening | ORAL | Status: DC | PRN

## 2014-03-14 NOTE — Telephone Encounter (Signed)
DONE

## 2014-03-14 NOTE — Telephone Encounter (Signed)
Pt rq rx on benazepril-hydrochlorthiazide (LOTENSIN HCT) 20-12.5 MG per tablet ,, lexapro , ambien .[facility]; [facility]

## 2014-05-10 ENCOUNTER — Other Ambulatory Visit: Payer: Self-pay | Admitting: Family

## 2014-06-19 DIAGNOSIS — H905 Unspecified sensorineural hearing loss: Secondary | ICD-10-CM | POA: Diagnosis not present

## 2014-06-19 DIAGNOSIS — Z9689 Presence of other specified functional implants: Secondary | ICD-10-CM | POA: Diagnosis not present

## 2014-06-21 ENCOUNTER — Other Ambulatory Visit: Payer: Self-pay

## 2014-06-21 MED ORDER — BENAZEPRIL-HYDROCHLOROTHIAZIDE 20-12.5 MG PO TABS: 1.0000 | ORAL_TABLET | Freq: Every day | ORAL | Status: DC

## 2014-06-28 ENCOUNTER — Ambulatory Visit (INDEPENDENT_AMBULATORY_CARE_PROVIDER_SITE_OTHER): Payer: Medicare Other | Admitting: Family

## 2014-06-28 ENCOUNTER — Encounter: Payer: Self-pay | Admitting: Family

## 2014-06-28 VITALS — BP 128/60 | HR 76 | Temp 98.4°F | Ht 68.0 in | Wt 136.0 lb

## 2014-06-28 DIAGNOSIS — M79609 Pain in unspecified limb: Secondary | ICD-10-CM

## 2014-06-28 DIAGNOSIS — M25529 Pain in unspecified elbow: Secondary | ICD-10-CM | POA: Diagnosis not present

## 2014-06-28 DIAGNOSIS — M25521 Pain in right elbow: Secondary | ICD-10-CM

## 2014-06-28 DIAGNOSIS — M79675 Pain in left toe(s): Secondary | ICD-10-CM

## 2014-06-28 DIAGNOSIS — I10 Essential (primary) hypertension: Secondary | ICD-10-CM

## 2014-06-28 LAB — URIC ACID: Uric Acid, Serum: 5.1 mg/dL (ref 4.0–7.8)

## 2014-06-28 MED ORDER — ZOLPIDEM TARTRATE 10 MG PO TABS: 10.0000 mg | ORAL_TABLET | Freq: Every evening | ORAL | Status: DC | PRN

## 2014-06-28 MED ORDER — METHYLPREDNISOLONE 4 MG PO KIT: PACK | ORAL | Status: AC

## 2014-06-28 MED ORDER — ESCITALOPRAM OXALATE 10 MG PO TABS: ORAL_TABLET | ORAL | Status: DC

## 2014-06-28 NOTE — Progress Notes (Signed)
Pre visit review using our clinic review tool, if applicable. No additional management support is needed unless otherwise documented below in the visit note. 

## 2014-06-28 NOTE — Progress Notes (Signed)
Subjective:    Patient ID: Michael Miranda, male    DOB: 07-17-1941, 73 y.o.   MRN: [id number]  Toe Pain     73 year old Caucasian male presents with complaints of right elbow pain, radiating into the medial forearm x 1 month. He denies repetitive motions or injury. He has taken ibuprofen with some relief. Pain is elicited with gripping finger motions.  He also reports pain in both great toes x several weeks, which he describes as burning when walking. He questions if it is arthritic changes, as there is a strong family history of RA.   For the past several months, he has stopped taking Livalo due to expense. He is not interested in another statin medication at this time.   Review of Systems  Constitutional: Negative.   Musculoskeletal: Negative for gait problem and joint swelling.       Pain in right elbow, both great toes.    Past Medical History  Diagnosis Date  . Hyperlipidemia   . Hypertension   . Depression   . Alcohol abuse   . Kidney stones   . Arthritis     History   Social History  . Marital Status: Married    Spouse Name: N/A    Number of Children: N/A  . Years of Education: N/A   Occupational History  . Not on file.   Social History Main Topics  . Smoking status: Never Smoker   . Smokeless tobacco: Not on file  . Alcohol Use: Yes  . Drug Use: No  . Sexual Activity: Not on file   Other Topics Concern  . Not on file   Social History Narrative  . No narrative on file    Past Surgical History  Procedure Laterality Date  . Hernia repair    . Cochlear implant      bilateral    No family history on file.  No Known Allergies  Current Outpatient Prescriptions on File Prior to Visit  Medication Sig Dispense Refill  . aspirin EC 81 MG tablet Take 1 tablet (81 mg total) by mouth daily.  1 tablet  0  . benazepril-hydrochlorthiazide (LOTENSIN HCT) 20-12.5 MG per tablet Take 1 tablet by mouth daily.  30 tablet  0  . traZODone (DESYREL) 50 MG tablet  Take one tablet by mouth at bedtime  30 tablet  0   No current facility-administered medications on file prior to visit.    BP 128/60  Pulse 76  Temp(Src) 98.4 F (36.9 C) (Oral)  Ht 5\' 8"  (1.727 m)  Wt 136 lb (61.689 kg)  BMI 20.68 kg/m2chart      Objective:   Physical Exam  Constitutional: No distress.  frail  HENT:  Head: Normocephalic.  Neck: Normal range of motion. Carotid bruit is not present.  Cardiovascular: Normal rate, regular rhythm, normal heart sounds and intact distal pulses.  PMI is not displaced.   Pulmonary/Chest: Effort normal.  Musculoskeletal:       Right shoulder: He exhibits pain.  Pain at R elbow with flexion       Assessment & Plan:  Michael Miranda was seen today for rt elbow pain and toe pain.  Diagnoses and associated orders for this visit:  Great toe pain, left - Uric Acid  Pain of great toe, left - Uric Acid  Pain in right elbow  Unspecified essential hypertension  Other Orders - zolpidem (AMBIEN) 10 MG tablet; Take 1 tablet (10 mg total) by mouth at bedtime as  needed. - escitalopram (LEXAPRO) 10 MG tablet; TAKE ONE TABLET BY MOUTH ONE TIME DAILY. - methylPREDNISolone (MEDROL DOSEPAK) 4 MG tablet; follow package directions   Call with any questions or concerns. Recheck as scheduled and in 6 months.

## 2014-07-13 ENCOUNTER — Encounter: Payer: Self-pay | Admitting: Family

## 2014-07-15 ENCOUNTER — Other Ambulatory Visit: Payer: Self-pay | Admitting: Family

## 2014-07-15 MED ORDER — PREDNISONE 20 MG PO TABS: ORAL_TABLET | ORAL | Status: AC

## 2014-08-08 ENCOUNTER — Other Ambulatory Visit: Payer: Self-pay | Admitting: Family

## 2014-09-20 ENCOUNTER — Other Ambulatory Visit: Payer: Self-pay

## 2014-10-04 DIAGNOSIS — H903 Sensorineural hearing loss, bilateral: Secondary | ICD-10-CM | POA: Diagnosis not present

## 2014-10-08 ENCOUNTER — Other Ambulatory Visit: Payer: Self-pay | Admitting: Family

## 2014-10-08 DIAGNOSIS — Z23 Encounter for immunization: Secondary | ICD-10-CM | POA: Diagnosis not present

## 2014-10-30 DIAGNOSIS — H9190 Unspecified hearing loss, unspecified ear: Secondary | ICD-10-CM | POA: Diagnosis not present

## 2014-11-27 ENCOUNTER — Encounter: Payer: Self-pay | Admitting: Family

## 2014-11-27 ENCOUNTER — Ambulatory Visit (INDEPENDENT_AMBULATORY_CARE_PROVIDER_SITE_OTHER): Payer: Medicare Other | Admitting: Family

## 2014-11-27 ENCOUNTER — Other Ambulatory Visit: Payer: Self-pay | Admitting: Family

## 2014-11-27 VITALS — BP 120/70 | HR 84 | Temp 98.3°F | Ht 68.0 in | Wt 134.0 lb

## 2014-11-27 DIAGNOSIS — IMO0002 Reserved for concepts with insufficient information to code with codable children: Secondary | ICD-10-CM

## 2014-11-27 DIAGNOSIS — G4700 Insomnia, unspecified: Secondary | ICD-10-CM | POA: Diagnosis not present

## 2014-11-27 DIAGNOSIS — N400 Enlarged prostate without lower urinary tract symptoms: Secondary | ICD-10-CM

## 2014-11-27 DIAGNOSIS — E7800 Pure hypercholesterolemia, unspecified: Secondary | ICD-10-CM

## 2014-11-27 DIAGNOSIS — F329 Major depressive disorder, single episode, unspecified: Secondary | ICD-10-CM

## 2014-11-27 DIAGNOSIS — Z0000 Encounter for general adult medical examination without abnormal findings: Secondary | ICD-10-CM | POA: Diagnosis not present

## 2014-11-27 DIAGNOSIS — Z23 Encounter for immunization: Secondary | ICD-10-CM

## 2014-11-27 DIAGNOSIS — M179 Osteoarthritis of knee, unspecified: Secondary | ICD-10-CM | POA: Diagnosis not present

## 2014-11-27 DIAGNOSIS — M1710 Unilateral primary osteoarthritis, unspecified knee: Secondary | ICD-10-CM

## 2014-11-27 DIAGNOSIS — F32A Depression, unspecified: Secondary | ICD-10-CM

## 2014-11-27 LAB — BASIC METABOLIC PANEL
BUN: 17 mg/dL (ref 6–23)
CO2: 30 mEq/L (ref 19–32)
Calcium: 9.7 mg/dL (ref 8.4–10.5)
Chloride: 100 mEq/L (ref 96–112)
Creatinine, Ser: 1.1 mg/dL (ref 0.4–1.5)
GFR: 71.21 mL/min (ref >60.00)
Glucose, Bld: 87 mg/dL (ref 70–99)
Potassium: 4.4 mEq/L (ref 3.5–5.1)
Sodium: 138 mEq/L (ref 135–145)

## 2014-11-27 LAB — POCT URINALYSIS DIPSTICK
Bilirubin, UA: NEGATIVE
Blood, UA: NEGATIVE
Glucose, UA: NEGATIVE
KETONES UA: NEGATIVE
NITRITE UA: NEGATIVE
PH UA: 7
PROTEIN UA: NEGATIVE
Spec Grav, UA: 1.01
Urobilinogen, UA: 0.2

## 2014-11-27 LAB — LIPID PANEL
CHOLESTEROL: 264 mg/dL — AB (ref 0–200)
HDL: 66.2 mg/dL (ref >39.00)
LDL Cholesterol: 182 mg/dL — ABNORMAL HIGH (ref 0–99)
NonHDL: 197.8
TRIGLYCERIDES: 81 mg/dL (ref 0.0–149.0)
Total CHOL/HDL Ratio: 4
VLDL: 16.2 mg/dL (ref 0.0–40.0)

## 2014-11-27 LAB — CBC WITH DIFFERENTIAL/PLATELET
Basophils Absolute: 0 10*3/uL (ref 0.0–0.1)
Basophils Relative: 0.6 % (ref 0.0–3.0)
Eosinophils Absolute: 0.1 10*3/uL (ref 0.0–0.7)
Eosinophils Relative: 2.9 % (ref 0.0–5.0)
HCT: 46.5 % (ref 39.0–52.0)
Hemoglobin: 15.9 g/dL (ref 13.0–17.0)
Lymphocytes Relative: 22.1 % (ref 12.0–46.0)
Lymphs Abs: 1.1 10*3/uL (ref 0.7–4.0)
MCHC: 34.2 g/dL (ref 30.0–36.0)
MCV: 98.7 fl (ref 78.0–100.0)
Monocytes Absolute: 0.7 10*3/uL (ref 0.1–1.0)
Monocytes Relative: 14.5 % — ABNORMAL HIGH (ref 3.0–12.0)
Neutro Abs: 2.9 10*3/uL (ref 1.4–7.7)
Neutrophils Relative %: 59.9 % (ref 43.0–77.0)
Platelets: 195 10*3/uL (ref 150.0–400.0)
RBC: 4.71 Mil/uL (ref 4.22–5.81)
RDW: 12.6 % (ref 11.5–15.5)
WBC: 4.8 10*3/uL (ref 4.0–10.5)

## 2014-11-27 LAB — HEPATIC FUNCTION PANEL
ALBUMIN: 4.5 g/dL (ref 3.5–5.2)
ALT: 17 U/L (ref 0–53)
AST: 26 U/L (ref 0–37)
Alkaline Phosphatase: 75 U/L (ref 39–117)
Bilirubin, Direct: 0.1 mg/dL (ref 0.0–0.3)
Total Bilirubin: 0.9 mg/dL (ref 0.2–1.2)
Total Protein: 6.9 g/dL (ref 6.0–8.3)

## 2014-11-27 LAB — PSA: PSA: 0.74 ng/mL (ref 0.10–4.00)

## 2014-11-27 MED ORDER — SIMVASTATIN 20 MG PO TABS: 20.0000 mg | ORAL_TABLET | Freq: Every day | ORAL | Status: DC

## 2014-11-27 MED ORDER — BENAZEPRIL-HYDROCHLOROTHIAZIDE 20-12.5 MG PO TABS: 1.0000 | ORAL_TABLET | Freq: Every day | ORAL | Status: DC

## 2014-11-27 MED ORDER — TRAZODONE HCL 50 MG PO TABS: 50.0000 mg | ORAL_TABLET | Freq: Every day | ORAL | Status: DC

## 2014-11-27 NOTE — Progress Notes (Signed)
Pre visit review using our clinic review tool, if applicable. No additional management support is needed unless otherwise documented below in the visit note. 

## 2014-11-27 NOTE — Progress Notes (Signed)
Subjective:    Patient ID: Michael Miranda, male    DOB: [birth year], 73 y.o.   MRN: [id number]  HPI 73 year old male, with a history of hypertension, depression, insomnia is in today for wellness exam. Has concerns of joint pain that typically is worse first thing in the morning. Has been taking aspirin that helps his symptoms. Also reports rectal bleeding approximately 2 months ago that has resolved. He attributes it to an herbal supplement that he been consuming. After discontinuing the supplement, the bleeding subsided. She had a colonoscopy approximately 6 years ago according to him. No family history of colon disease.  Patient presents for yearly preventative medicine examination. Medicare questionnaire was completed  All immunizations and health maintenance protocols were reviewed with the patient and needed orders were placed.  Appropriate screening laboratory values were ordered for the patient including screening of hyperlipidemia, renal function and hepatic function. If indicated by BPH, a PSA was ordered.  Medication reconciliation,  past medical history, social history, problem list and allergies were reviewed in detail with the patient  Goals were established with regard to weight loss, exercise, and  diet in compliance with medications  End of life planning was discussed.   Review of Systems  Constitutional: Negative.   HENT: Negative.   Eyes: Negative.   Respiratory: Negative.   Cardiovascular: Negative.   Gastrointestinal: Positive for anal bleeding. Negative for diarrhea, constipation, abdominal distention and rectal pain.  Endocrine: Negative.   Genitourinary: Negative.   Musculoskeletal: Positive for arthralgias.  Skin: Negative.   Allergic/Immunologic: Negative.   Neurological: Negative.   Hematological: Negative.   Psychiatric/Behavioral: Negative.    Past Medical History  Diagnosis Date  . Hyperlipidemia   . Hypertension   . Depression   . Alcohol  abuse   . Kidney stones   . Arthritis     History   Social History  . Marital Status: Married    Spouse Name: N/A    Number of Children: N/A  . Years of Education: N/A   Occupational History  . Not on file.   Social History Main Topics  . Smoking status: Never Smoker   . Smokeless tobacco: Not on file  . Alcohol Use: Yes  . Drug Use: No  . Sexual Activity: Not on file   Other Topics Concern  . Not on file   Social History Narrative  . No narrative on file    Past Surgical History  Procedure Laterality Date  . Hernia repair    . Cochlear implant      bilateral    No family history on file.  No Known Allergies  Current Outpatient Prescriptions on File Prior to Visit  Medication Sig Dispense Refill  . aspirin EC 81 MG tablet Take 1 tablet (81 mg total) by mouth daily. 1 tablet 0  . benazepril-hydrochlorthiazide (LOTENSIN HCT) 20-12.5 MG per tablet take 1 tablet by mouth once daily 30 tablet 0  . escitalopram (LEXAPRO) 10 MG tablet TAKE ONE TABLET BY MOUTH ONE TIME DAILY. 90 tablet 0  . traZODone (DESYREL) 50 MG tablet TAKE ONE TABLET BY MOUTH NIGHTLY AT BEDTIME  90 tablet 0  . zolpidem (AMBIEN) 10 MG tablet take 1 tablet by mouth at bedtime for sleep 30 tablet 1   No current facility-administered medications on file prior to visit.    There were no vitals taken for this visit.chart    Objective:   Physical Exam  Constitutional: He is oriented to person, place, and time.  He appears well-developed and well-nourished.  HENT:  Head: Normocephalic and atraumatic.  Right Ear: External ear normal.  Left Ear: External ear normal.  Nose: Nose normal.  Mouth/Throat: Oropharynx is clear and moist.  Eyes: Conjunctivae and EOM are normal. Pupils are equal, round, and reactive to light.  Neck: Normal range of motion. Neck supple. No thyromegaly present.  Cardiovascular: Normal rate, regular rhythm and normal heart sounds.   Pulmonary/Chest: Effort normal and breath  sounds normal.  Abdominal: Soft. Bowel sounds are normal.  Genitourinary: Rectum normal, prostate normal and penis normal. Guaiac negative stool. No penile tenderness.  Neurological: He is alert and oriented to person, place, and time. He has normal reflexes. He displays normal reflexes. No cranial nerve deficit.  Skin: Skin is warm and dry.  Psychiatric: He has a normal mood and affect.          Assessment & Plan:  Daylen was seen today for no specified reason.  Diagnoses and associated orders for this visit:  Medicare annual wellness visit, subsequent  Depression - Basic Metabolic Panel - CBC with Differential  Insomnia - Basic Metabolic Panel - CBC with Differential  Osteoarthrosis, unspecified whether generalized or localized, involving lower leg - Basic Metabolic Panel - CBC with Differential  Benign prostate hyperplasia - PSA - Hepatic Function Panel - CBC with Differential - POC Urinalysis Dipstick  Pure hypercholesterolemia - Hepatic Function Panel - Lipid Panel - CBC with Differential - POC Urinalysis Dipstick  Other Orders - benazepril-hydrochlorthiazide (LOTENSIN HCT) 20-12.5 MG per tablet; Take 1 tablet by mouth daily. - traZODone (DESYREL) 50 MG tablet; Take 1 tablet (50 mg total) by mouth at bedtime.    call the office with any questions or concerns. Strongly encouraged colonoscopy screening. Obtain medical records from previous PCP to include vaccine history. Labs obtained today. Follow-up in 6 months and sooner as needed. Will call if he decides to have colonoscopy screening done. Do not take any anti-inflammatory medications.

## 2014-11-27 NOTE — Patient Instructions (Signed)
1. Strongly recommend colonoscopy to evaluate bleeding.   Osteoarthritis Osteoarthritis is a disease that causes soreness and inflammation of a joint. It occurs when the cartilage at the affected joint wears down. Cartilage acts as a cushion, covering the ends of bones where they meet to form a joint. Osteoarthritis is the most common form of arthritis. It often occurs in older people. The joints affected most often by this condition include those in the:  Ends of the fingers.  Thumbs.  Neck.  Lower back.  Knees.  Hips. CAUSES  Over time, the cartilage that covers the ends of bones begins to wear away. This causes bone to rub on bone, producing pain and stiffness in the affected joints.  RISK FACTORS Certain factors can increase your chances of having osteoarthritis, including:  Older age.  Excessive body weight.  Overuse of joints.  Previous joint injury. SIGNS AND SYMPTOMS   Pain, swelling, and stiffness in the joint.  Over time, the joint may lose its normal shape.  Small deposits of bone (osteophytes) may grow on the edges of the joint.  Bits of bone or cartilage can break off and float inside the joint space. This may cause more pain and damage. DIAGNOSIS  Your health care provider will do a physical exam and ask about your symptoms. Various tests may be ordered, such as:  X-rays of the affected joint.  An MRI scan.  Blood tests to rule out other types of arthritis.  Joint fluid tests. This involves using a needle to draw fluid from the joint and examining the fluid under a microscope. TREATMENT  Goals of treatment are to control pain and improve joint function. Treatment plans may include:  A prescribed exercise program that allows for rest and joint relief.  A weight control plan.  Pain relief techniques, such as:  Properly applied heat and cold.  Electric pulses delivered to nerve endings under the skin (transcutaneous electrical nerve stimulation  [TENS]).  Massage.  Certain nutritional supplements.  Medicines to control pain, such as:  Acetaminophen.  Nonsteroidal anti-inflammatory drugs (NSAIDs), such as naproxen.  Narcotic or central-acting agents, such as tramadol.  Corticosteroids. These can be given orally or as an injection.  Surgery to reposition the bones and relieve pain (osteotomy) or to remove loose pieces of bone and cartilage. Joint replacement may be needed in advanced states of osteoarthritis. HOME CARE INSTRUCTIONS   Take medicines only as directed by your health care provider.  Maintain a healthy weight. Follow your health care provider's instructions for weight control. This may include dietary instructions.  Exercise as directed. Your health care provider can recommend specific types of exercise. These may include:  Strengthening exercises. These are done to strengthen the muscles that support joints affected by arthritis. They can be performed with weights or with exercise bands to add resistance.  Aerobic activities. These are exercises, such as brisk walking or low-impact aerobics, that get your heart pumping.  Range-of-motion activities. These keep your joints limber.  Balance and agility exercises. These help you maintain daily living skills.  Rest your affected joints as directed by your health care provider.  Keep all follow-up visits as directed by your health care provider. SEEK MEDICAL CARE IF:   Your skin turns red.  You develop a rash in addition to your joint pain.  You have worsening joint pain.  You have a fever along with joint or muscle aches. SEEK IMMEDIATE MEDICAL CARE IF:  You have a significant loss of weight  or appetite.  You have night sweats. [place] of Arthritis and Musculoskeletal and Skin Diseases: www.niams.SouthExposed.es  [employer] on Aging: http://kim-miller.com/  American College of Rheumatology:  www.rheumatology.org Document Released: 11/22/2005 Document Revised: 04/08/2014 Document Reviewed: 07/30/2013 [facility] Patient Information 2015 [place], [place]. This information is not intended to replace advice given to you by your health care provider. Make sure you discuss any questions you have with your health care provider.

## 2014-11-27 NOTE — Addendum Note (Signed)
Addended by: Santiago Bumpers on: 11/27/2014 10:47 AM   Modules accepted: Orders

## 2014-12-12 DIAGNOSIS — H2513 Age-related nuclear cataract, bilateral: Secondary | ICD-10-CM | POA: Diagnosis not present

## 2014-12-12 DIAGNOSIS — D3141 Benign neoplasm of right ciliary body: Secondary | ICD-10-CM | POA: Diagnosis not present

## 2015-01-15 ENCOUNTER — Other Ambulatory Visit: Payer: Self-pay | Admitting: Family

## 2015-02-27 ENCOUNTER — Encounter: Payer: Self-pay | Admitting: Family

## 2015-02-27 ENCOUNTER — Ambulatory Visit (INDEPENDENT_AMBULATORY_CARE_PROVIDER_SITE_OTHER): Payer: Medicare Other | Admitting: Family

## 2015-02-27 ENCOUNTER — Telehealth: Payer: Self-pay | Admitting: Family

## 2015-02-27 VITALS — BP 128/78 | HR 77 | Temp 97.9°F | Ht 68.0 in | Wt 136.6 lb

## 2015-02-27 DIAGNOSIS — R350 Frequency of micturition: Secondary | ICD-10-CM

## 2015-02-27 DIAGNOSIS — R0789 Other chest pain: Secondary | ICD-10-CM

## 2015-02-27 DIAGNOSIS — I10 Essential (primary) hypertension: Secondary | ICD-10-CM | POA: Diagnosis not present

## 2015-02-27 DIAGNOSIS — E7800 Pure hypercholesterolemia, unspecified: Secondary | ICD-10-CM

## 2015-02-27 DIAGNOSIS — F411 Generalized anxiety disorder: Secondary | ICD-10-CM

## 2015-02-27 LAB — BASIC METABOLIC PANEL
BUN: 20 mg/dL (ref 6–23)
CO2: 31 meq/L (ref 19–32)
Calcium: 9.7 mg/dL (ref 8.4–10.5)
Chloride: 101 mEq/L (ref 96–112)
Creatinine, Ser: 1.12 mg/dL (ref 0.40–1.50)
GFR: 68.23 mL/min (ref >60.00)
GLUCOSE: 98 mg/dL (ref 70–99)
POTASSIUM: 4.4 meq/L (ref 3.5–5.1)
SODIUM: 136 meq/L (ref 135–145)

## 2015-02-27 LAB — HEPATIC FUNCTION PANEL
ALK PHOS: 71 U/L (ref 39–117)
ALT: 17 U/L (ref 0–53)
AST: 29 U/L (ref 0–37)
Albumin: 4.4 g/dL (ref 3.5–5.2)
BILIRUBIN DIRECT: 0.1 mg/dL (ref 0.0–0.3)
BILIRUBIN TOTAL: 0.6 mg/dL (ref 0.2–1.2)
Total Protein: 7.1 g/dL (ref 6.0–8.3)

## 2015-02-27 LAB — LIPID PANEL
CHOLESTEROL: 194 mg/dL (ref 0–200)
HDL: 91.9 mg/dL (ref >39.00)
LDL CALC: 76 mg/dL (ref 0–99)
NonHDL: 102.1
Total CHOL/HDL Ratio: 2
Triglycerides: 130 mg/dL (ref 0.0–149.0)
VLDL: 26 mg/dL (ref 0.0–40.0)

## 2015-02-27 LAB — POCT URINALYSIS DIPSTICK
BILIRUBIN UA: NEGATIVE
GLUCOSE UA: NEGATIVE
Ketones, UA: NEGATIVE
Leukocytes, UA: NEGATIVE
Nitrite, UA: NEGATIVE
Protein, UA: NEGATIVE
RBC UA: NEGATIVE
Spec Grav, UA: 1.01
UROBILINOGEN UA: 0.2
pH, UA: 8.5

## 2015-02-27 NOTE — Progress Notes (Signed)
Subjective:    Patient ID: Michael Miranda, male    DOB: 1941/11/14, 74 y.o.   MRN: [id number]  HPI 74 year old white male, nonsmoker is in today for a recheck of hypercholesterolemia, HTN, insomnia. Reports having myalgias taking 20 mg of Zocor. Does well taking 10 mg. Exercises 3-4 days a week and reports having chest tightness with brisk walking. Tightness stops with he ceases exercise. Has a history of anxiety. Had a stress test done 2 years ago that was negative. Is caring for an ailing wife.   Review of Systems  Constitutional: Negative.   HENT: Negative.   Respiratory: Negative.   Cardiovascular: Negative for palpitations and leg swelling.       Chest tightness  Gastrointestinal: Negative.   Endocrine: Negative.   Genitourinary: Positive for frequency. Negative for dysuria, urgency, decreased urine volume and penile pain.  Musculoskeletal: Positive for myalgias. Negative for back pain, arthralgias and neck stiffness.  Skin: Negative.   Allergic/Immunologic: Negative.   Neurological: Negative.   Hematological: Negative.   Psychiatric/Behavioral: Negative.    Past Medical History  Diagnosis Date  . Hyperlipidemia   . Hypertension   . Depression   . Alcohol abuse   . Kidney stones   . Arthritis     History   Social History  . Marital Status: Married    Spouse Name: N/A  . Number of Children: N/A  . Years of Education: N/A   Occupational History  . Not on file.   Social History Main Topics  . Smoking status: Never Smoker   . Smokeless tobacco: Not on file  . Alcohol Use: Yes  . Drug Use: No  . Sexual Activity: Not on file   Other Topics Concern  . Not on file   Social History Narrative    Past Surgical History  Procedure Laterality Date  . Hernia repair    . Cochlear implant      bilateral    No family history on file.  No Known Allergies  Current Outpatient Prescriptions on File Prior to Visit  Medication Sig Dispense Refill  . aspirin EC  81 MG tablet Take 1 tablet (81 mg total) by mouth daily. 1 tablet 0  . benazepril-hydrochlorthiazide (LOTENSIN HCT) 20-12.5 MG per tablet Take 1 tablet by mouth daily. 90 tablet 1  . simvastatin (ZOCOR) 20 MG tablet Take 1 tablet (20 mg total) by mouth at bedtime. 30 tablet 3  . traZODone (DESYREL) 50 MG tablet Take 1 tablet (50 mg total) by mouth at bedtime. 90 tablet 1  . zolpidem (AMBIEN) 10 MG tablet take 1 tablet by mouth once daily at bedtime for sleep 30 tablet 1   No current facility-administered medications on file prior to visit.    BP 128/78 mmHg  Pulse 77  Temp(Src) 97.9 F (36.6 C) (Oral)  Ht 5\' 8"  (1.727 m)  Wt 136 lb 9.6 oz (61.961 kg)  BMI 20.77 kg/m2chart    Objective:   Physical Exam  Constitutional: He is oriented to person, place, and time. He appears well-developed and well-nourished.  HENT:  Right Ear: External ear normal.  Left Ear: External ear normal.  Nose: Nose normal.  Mouth/Throat: Oropharynx is clear and moist.  Neck: Normal range of motion. Neck supple.  Cardiovascular: Normal rate, regular rhythm and normal heart sounds.   Pulmonary/Chest: Effort normal and breath sounds normal.  Abdominal: Soft. Bowel sounds are normal.  Musculoskeletal: Normal range of motion.  Neurological: He is alert and oriented  to person, place, and time.  Skin: Skin is warm and dry.  Psychiatric: He has a normal mood and affect.          Assessment & Plan:  Michael Miranda was seen today for follow-up.  Diagnoses and all orders for this visit:  Essential hypertension Orders: -     Basic Metabolic Panel -     Hepatic Function Panel  Chest tightness Orders: -     Ambulatory referral to Cardiology  Urinary frequency Orders: -     POCT urinalysis dipstick  Pure hypercholesterolemia Orders: -     Lipid Panel -     Basic Metabolic Panel -     Hepatic Function Panel  Generalized anxiety disorder   I have advised that he not exercise until he sees cardiology  and gets clearance. UA obtained today for urinary frequency. PSA was normal. Continue simvastatin at 10 mg once daily due to myalgias. Go to the emergency department if you develop chest pain or shortness of breath.

## 2015-02-27 NOTE — Patient Instructions (Signed)
Fat and Cholesterol Control Diet Fat and cholesterol levels in your blood and organs are influenced by your diet. High levels of fat and cholesterol may lead to diseases of the heart, small and large blood vessels, gallbladder, liver, and pancreas. CONTROLLING FAT AND CHOLESTEROL WITH DIET Although exercise and lifestyle factors are important, your diet is key. That is because certain foods are known to raise cholesterol and others to lower it. The goal is to balance foods for their effect on cholesterol and more importantly, to replace saturated and trans fat with other types of fat, such as monounsaturated fat, polyunsaturated fat, and omega-3 fatty acids. On average, a person should consume no more than 15 to 17 g of saturated fat daily. Saturated and trans fats are considered "bad" fats, and they will raise LDL cholesterol. Saturated fats are primarily found in animal products such as meats, butter, and cream. However, that does not mean you need to give up all your favorite foods. Today, there are good tasting, low-fat, low-cholesterol substitutes for most of the things you like to eat. Choose low-fat or nonfat alternatives. Choose round or loin cuts of red meat. These types of cuts are lowest in fat and cholesterol. Chicken (without the skin), fish, veal, and ground turkey breast are great choices. Eliminate fatty meats, such as hot dogs and salami. Even shellfish have little or no saturated fat. Have a 3 oz (85 g) portion when you eat lean meat, poultry, or fish. Trans fats are also called "partially hydrogenated oils." They are oils that have been scientifically manipulated so that they are solid at room temperature resulting in a longer shelf life and improved taste and texture of foods in which they are added. Trans fats are found in stick margarine, some tub margarines, cookies, crackers, and baked goods.  When baking and cooking, oils are a great substitute for butter. The monounsaturated oils are  especially beneficial since it is believed they lower LDL and raise HDL. The oils you should avoid entirely are saturated tropical oils, such as coconut and palm.  Remember to eat a lot from food groups that are naturally free of saturated and trans fat, including fish, fruit, vegetables, beans, grains (barley, rice, couscous, bulgur wheat), and pasta (without cream sauces).  IDENTIFYING FOODS THAT LOWER FAT AND CHOLESTEROL  Soluble fiber may lower your cholesterol. This type of fiber is found in fruits such as apples, vegetables such as broccoli, potatoes, and carrots, legumes such as beans, peas, and lentils, and grains such as barley. Foods fortified with plant sterols (phytosterol) may also lower cholesterol. You should eat at least 2 g per day of these foods for a cholesterol lowering effect.  Read package labels to identify low-saturated fats, trans fat free, and low-fat foods at the supermarket. Select cheeses that have only 2 to 3 g saturated fat per ounce. Use a heart-healthy tub margarine that is free of trans fats or partially hydrogenated oil. When buying baked goods (cookies, crackers), avoid partially hydrogenated oils. Breads and muffins should be made from whole grains (whole-wheat or whole oat flour, instead of "flour" or "enriched flour"). Buy non-creamy canned soups with reduced salt and no added fats.  FOOD PREPARATION TECHNIQUES  Never deep-fry. If you must fry, either stir-fry, which uses very little fat, or use non-stick cooking sprays. When possible, broil, bake, or roast meats, and steam vegetables. Instead of putting butter or margarine on vegetables, use lemon and herbs, applesauce, and cinnamon (for squash and sweet potatoes). Use nonfat   yogurt, salsa, and low-fat dressings for salads.  LOW-SATURATED FAT / LOW-FAT FOOD SUBSTITUTES Meats / Saturated Fat (g)  Avoid: Steak, marbled (3 oz/85 g) / 11 g  Choose: Steak, lean (3 oz/85 g) / 4 g  Avoid: Hamburger (3 oz/85 g) / 7  g  Choose: Hamburger, lean (3 oz/85 g) / 5 g  Avoid: Ham (3 oz/85 g) / 6 g  Choose: Ham, lean cut (3 oz/85 g) / 2.4 g  Avoid: Chicken, with skin, dark meat (3 oz/85 g) / 4 g  Choose: Chicken, skin removed, dark meat (3 oz/85 g) / 2 g  Avoid: Chicken, with skin, light meat (3 oz/85 g) / 2.5 g  Choose: Chicken, skin removed, light meat (3 oz/85 g) / 1 g Dairy / Saturated Fat (g)  Avoid: Whole milk (1 cup) / 5 g  Choose: Low-fat milk, 2% (1 cup) / 3 g  Choose: Low-fat milk, 1% (1 cup) / 1.5 g  Choose: Skim milk (1 cup) / 0.3 g  Avoid: Hard cheese (1 oz/28 g) / 6 g  Choose: Skim milk cheese (1 oz/28 g) / 2 to 3 g  Avoid: Cottage cheese, 4% fat (1 cup) / 6.5 g  Choose: Low-fat cottage cheese, 1% fat (1 cup) / 1.5 g  Avoid: Ice cream (1 cup) / 9 g  Choose: Sherbet (1 cup) / 2.5 g  Choose: Nonfat frozen yogurt (1 cup) / 0.3 g  Choose: Frozen fruit bar / trace  Avoid: Whipped cream (1 tbs) / 3.5 g  Choose: Nondairy whipped topping (1 tbs) / 1 g Condiments / Saturated Fat (g)  Avoid: Mayonnaise (1 tbs) / 2 g  Choose: Low-fat mayonnaise (1 tbs) / 1 g  Avoid: Butter (1 tbs) / 7 g  Choose: Extra light margarine (1 tbs) / 1 g  Avoid: Coconut oil (1 tbs) / 11.8 g  Choose: Olive oil (1 tbs) / 1.8 g  Choose: Corn oil (1 tbs) / 1.7 g  Choose: Safflower oil (1 tbs) / 1.2 g  Choose: Sunflower oil (1 tbs) / 1.4 g  Choose: Soybean oil (1 tbs) / 2.4 g  Choose: Canola oil (1 tbs) / 1 g Document Released: 11/22/2005 Document Revised: 03/19/2013 Document Reviewed: 02/20/2014 [employer] Patient Information 2015 [employer]. This information is not intended to replace advice given to you by your health care provider. Make sure you discuss any questions you have with your health care provider.  

## 2015-02-27 NOTE — Telephone Encounter (Signed)
emmi emailed °

## 2015-02-27 NOTE — Progress Notes (Signed)
Pre visit review using our clinic review tool, if applicable. No additional management support is needed unless otherwise documented below in the visit note. 

## 2015-03-04 ENCOUNTER — Ambulatory Visit: Payer: Federal, State, Local not specified - PPO | Admitting: Cardiovascular Disease

## 2015-04-15 ENCOUNTER — Ambulatory Visit: Payer: Federal, State, Local not specified - PPO | Admitting: Cardiovascular Disease

## 2015-04-30 ENCOUNTER — Encounter: Payer: Self-pay | Admitting: *Deleted

## 2015-05-02 ENCOUNTER — Ambulatory Visit (INDEPENDENT_AMBULATORY_CARE_PROVIDER_SITE_OTHER): Payer: Medicare Other | Admitting: Cardiovascular Disease

## 2015-05-02 ENCOUNTER — Encounter: Payer: Self-pay | Admitting: Cardiovascular Disease

## 2015-05-02 VITALS — BP 130/60 | HR 69 | Ht 69.0 in | Wt 136.0 lb

## 2015-05-02 DIAGNOSIS — E7800 Pure hypercholesterolemia, unspecified: Secondary | ICD-10-CM

## 2015-05-02 DIAGNOSIS — I10 Essential (primary) hypertension: Secondary | ICD-10-CM | POA: Diagnosis not present

## 2015-05-02 NOTE — Patient Instructions (Signed)

## 2015-05-02 NOTE — Progress Notes (Signed)
Cardiology Office Note   Date:  05/02/2015   ID:  Michael Miranda, DOB [birth year], MRN [id number]  PCP:  Kennyth Arnold, FNP  Cardiologist:  Sherren Mocha, MD    No chief complaint on file.  History of Present Illness: Michael Miranda is a 74 y.o. male who presents for follow-up evaluation. He was initially seen for evaluation of chest pain in 2014. At that time he underwent a stress Myoview scan that showed thinning of the inferior wall with normal LV function and no ischemia. The study was low-risk and medical therapy was recommended. He reports mild chest discomfort described as a pressure when he walks briskly. He is able to walk through this and symptoms resolve. There is no change in his chest pain over the past 2 years. He also experiences this at times with emotional stress. No dyspnea, edema, orthopnea, or PND. He remains active with regular walking for exercise.    Past Medical History  Diagnosis Date  . Hyperlipidemia   . Hypertension   . Depression   . Alcohol abuse   . Kidney stones   . Arthritis     Past Surgical History  Procedure Laterality Date  . Hernia repair    . Cochlear implant      bilateral    Current Outpatient Prescriptions  Medication Sig Dispense Refill  . aspirin EC 81 MG tablet Take 1 tablet (81 mg total) by mouth daily. 1 tablet 0  . benazepril-hydrochlorthiazide (LOTENSIN HCT) 20-12.5 MG per tablet Take 1 tablet by mouth daily. 90 tablet 1  . simvastatin (ZOCOR) 20 MG tablet Take 1 tablet (20 mg total) by mouth at bedtime. 30 tablet 3  . traZODone (DESYREL) 50 MG tablet Take 1 tablet (50 mg total) by mouth at bedtime. 90 tablet 1  . zolpidem (AMBIEN) 10 MG tablet take 1 tablet by mouth once daily at bedtime for sleep 30 tablet 1   No current facility-administered medications for this visit.    Allergies:   Review of patient's allergies indicates no known allergies.   Social History:  The patient  reports that he has never smoked. He  does not have any smokeless tobacco history on file. He reports that he drinks alcohol. He reports that he does not use illicit drugs.   Family History:  The patient's family history includes Cancer in his father; Coronary artery disease in an other family member; Heart attack in his brother.    ROS:  Please see the history of present illness.  Otherwise, review of systems is positive for depression, easy bruising, chest pressure.  All other systems are reviewed and negative.    PHYSICAL EXAM: VS:  BP 130/60 mmHg  Pulse 69  Ht 5\' 9"  (1.753 m)  Wt 136 lb (61.689 kg)  BMI 20.07 kg/m2 , BMI Body mass index is 20.07 kg/(m^2). GEN: Well nourished, well developed, pleasant thin male in no acute distress HEENT: normal Neck: no JVD, no masses. No carotid bruits Cardiac: RRR without murmur or gallop                Respiratory:  clear to auscultation bilaterally, normal work of breathing GI: soft, nontender, nondistended, + BS MS: no deformity or atrophy Ext: no pretibial edema, pedal pulses 2+= bilaterally Skin: warm and dry, no rash Neuro:  Strength and sensation are intact Psych: euthymic mood, full affect  EKG:  EKG is ordered today. The ekg ordered today shows NSR 69 bpm age-indeterminate septal infarct, no  change from previous  Recent Labs: 11/27/2014: Hemoglobin 15.9; Platelets 195.0 02/27/2015: ALT 17; BUN 20; Creatinine 1.12; Potassium 4.4; Sodium 136   Lipid Panel     Component Value Date/Time   CHOL 194 02/27/2015 1213   TRIG 130.0 02/27/2015 1213   HDL 91.90 02/27/2015 1213   CHOLHDL 2 02/27/2015 1213   VLDL 26.0 02/27/2015 1213   LDLCALC 76 02/27/2015 1213   LDLDIRECT 92.3 10/01/2013 0916      Wt Readings from Last 3 Encounters:  05/02/15 136 lb (61.689 kg)  02/27/15 136 lb 9.6 oz (61.961 kg)  11/27/14 134 lb (60.782 kg)     Cardiac Studies Reviewed: Impression Exercise Capacity: Fair exercise capacity. BP Response: Normal blood pressure response. Clinical  Symptoms: "Slight chest discomfort" ECG Impression: No significant ST segment change suggestive of ischemia. Comparison with Prior Nuclear Study: No images to compare  Overall Impression: Normal stress nuclear study with a small, mild intensity, fixed inferior defect consistent with thinning; no ischemia.  LV Ejection Fraction: 70%. LV Wall Motion: NL LV Function; NL Wall Motion  ASSESSMENT AND PLAN: 1.  Chest pain with typical and atypical features: he reports no change in symptoms since I saw him 2 years ago. His stress test from the time of his initial evaluation was reviewed and is low-risk. I recommended continued medical therapy for risk reduction. I'll see him back in one year.   HTN: BP well-controlled on current Rx.  3. Hyperlipidemia: he's had an excellent response to simvastatin with reduction in LDL cholesterol from 182 down to 76 mg/dL.   Current medicines are reviewed with the patient today.  The patient does not have concerns regarding medicines.  Labs/ tests ordered today include:  No orders of the defined types were placed in this encounter.    Disposition:   FU one year  Signed, Sherren Mocha, MD  05/02/2015 1:46 PM    [facility] [address], [place], [place]  [address] Phone: [phone]; Fax: [phone]

## 2015-05-03 ENCOUNTER — Encounter: Payer: Self-pay | Admitting: Cardiovascular Disease

## 2015-05-12 ENCOUNTER — Other Ambulatory Visit: Payer: Self-pay | Admitting: Family

## 2015-06-11 ENCOUNTER — Ambulatory Visit (INDEPENDENT_AMBULATORY_CARE_PROVIDER_SITE_OTHER): Payer: Medicare Other | Admitting: Adult Health

## 2015-06-11 ENCOUNTER — Encounter: Payer: Self-pay | Admitting: Adult Health

## 2015-06-11 VITALS — Temp 98.1°F | Ht 69.0 in | Wt 135.0 lb

## 2015-06-11 DIAGNOSIS — G4700 Insomnia, unspecified: Secondary | ICD-10-CM | POA: Diagnosis not present

## 2015-06-11 DIAGNOSIS — R5382 Chronic fatigue, unspecified: Secondary | ICD-10-CM | POA: Diagnosis not present

## 2015-06-11 DIAGNOSIS — Z7689 Persons encountering health services in other specified circumstances: Secondary | ICD-10-CM

## 2015-06-11 DIAGNOSIS — I10 Essential (primary) hypertension: Secondary | ICD-10-CM | POA: Diagnosis not present

## 2015-06-11 DIAGNOSIS — E7800 Pure hypercholesterolemia, unspecified: Secondary | ICD-10-CM

## 2015-06-11 DIAGNOSIS — Z7189 Other specified counseling: Secondary | ICD-10-CM

## 2015-06-11 MED ORDER — SIMVASTATIN 20 MG PO TABS: 20.0000 mg | ORAL_TABLET | Freq: Every day | ORAL | Status: DC

## 2015-06-11 NOTE — Progress Notes (Signed)
HPI:  Michael Miranda is here to establish care. He is a pleasant caucasian male with the  has a past medical history of Hyperlipidemia; Hypertension; Depression; Alcohol abuse; Kidney stones; and Arthritis.  Last PCP and physical:11/2014 with NP Justin Mend   Has the following chronic problems that require follow up and concerns today:  Fatigue - He has chronic fatigue. He feels really tired in the morning and then when he gets up and moving he feels better. He takes a nap for an hour or two in the afternoon.   Denies any other complaints.    ROS negative for unless reported above: fevers, chills,feeling poorly, unintentional weight loss, hearing or vision loss, chest pain, palpitations, leg claudication, struggling to breath,Not feeling congested in the chest, no orthopenia, no cough,no wheezing, normal appetite, no soft tissue swelling, no hemoptysis, melena, hematochezia, hematuria, falls, loc, si, or thoughts of self harm.  Immunizations: Needs Pneuovax next year and will check on Zostavax Diet:Tries to eat healthy. Lean meats and fish, fruits and vegetable. Very little red meat.  Exercise: Walks every morning for 30 minutes  Colonoscopy:2010 - Normal  Eye Doctor: Yearly  Dentist:Every six months   Past Medical History  Diagnosis Date  . Hyperlipidemia   . Hypertension   . Depression   . Alcohol abuse   . Kidney stones   . Arthritis     Past Surgical History  Procedure Laterality Date  . Hernia repair    . Cochlear implant      bilateral    Family History  Problem Relation Age of Onset  . Heart attack Brother   . Coronary artery disease    . Cancer Father     lung    History   Social History  . Marital Status: Married    Spouse Name: N/A  . Number of Children: N/A  . Years of Education: N/A   Social History Main Topics  . Smoking status: Never Smoker   . Smokeless tobacco: Not on file  . Alcohol Use: Yes  . Drug Use: No  . Sexual Activity: Not on file    Other Topics Concern  . None   Social History Narrative     Current outpatient prescriptions:  .  aspirin EC 81 MG tablet, Take 1 tablet (81 mg total) by mouth daily., Disp: 1 tablet, Rfl: 0 .  benazepril-hydrochlorthiazide (LOTENSIN HCT) 20-12.5 MG per tablet, Take 1 tablet by mouth daily., Disp: 90 tablet, Rfl: 1 .  simvastatin (ZOCOR) 20 MG tablet, Take 1 tablet (20 mg total) by mouth at bedtime., Disp: 30 tablet, Rfl: 3 .  traZODone (DESYREL) 50 MG tablet, Take 1 tablet (50 mg total) by mouth at bedtime., Disp: 90 tablet, Rfl: 1 .  zolpidem (AMBIEN) 10 MG tablet, take 1 tablet by mouth once daily at bedtime for sleep, Disp: 30 tablet, Rfl: 1  EXAM:  Filed Vitals:   06/11/15 0955  Temp: 98.1 F (36.7 C)    Body mass index is 19.93 kg/(m^2).  GENERAL: vitals reviewed and listed above, alert, oriented, appears well hydrated and in no acute distress. He has a thin body stature  HEENT: atraumatic, conjunttiva clear, no obvious abnormalities on inspection of external nose and ears. He has bilateral cochlear implants and wears glasses  NECK: Neck is soft and supple without masses, no adenopathy or thyromegaly, trachea midline, no JVD. Normal range of motion.   LUNGS: clear to auscultation bilaterally, no wheezes, rales or rhonchi, good air movement  CV: Regular rate and rhythm, normal S1/S2, no audible murmurs, gallops, or rubs. No carotid bruit and no peripheral edema.   MS: moves all extremities without noticeable abnormality. No edema noted  Abd: soft/nontender/nondistended/normal bowel sounds   Skin: warm and dry, no rash   Extremities: No clubbing, cyanosis, or edema. Capillary refill is WNL. Pulses intact bilaterally in upper and lower extremities.   Neuro: CN II-XII intact, sensation and reflexes normal throughout, 5/5 muscle strength in bilateral upper and lower extremities. Normal finger to nose. Normal rapid alternating movements.  PSYCH: pleasant and  cooperative, no obvious depression or anxiety  ASSESSMENT AND PLAN:  1. Encounter to establish care - Follow up in December for CPE - Follow up sooner if needed  2. Essential hypertension - Controlled on current medication regimen - no change  3. Insomnia - Is taking Ambian 10 mg for 10 years. We spoke about decreasing the amount of ambien he uses and will look at alternatives to this medication. He is going to try Melatonin. He was educated on the possibility of not sleeping much for the next few days to a week.  - He also uses Trazadone to help him fall asleep. Educated on the dangers of using both of these medications together.   4. Chronic fatigue - Likely do to his Ambien and Trazadone use.  - Taper off both  5. Hypercholesteremia - simvastatin (ZOCOR) 20 MG tablet; Take 1 tablet (20 mg total) by mouth at bedtime.  Dispense: 30 tablet; Refill: 3   No diagnosis found. -We reviewed the PMH, PSH, FH, SH, Meds and Allergies. -We provided refills for any medications we will prescribe as needed. -We addressed current concerns per orders and patient instructions. -We have asked for records for pertinent exams, studies, vaccines and notes from previous providers. -We have advised patient to follow up per instructions below.   -Patient advised to return or notify a provider immediately if symptoms worsen or persist or new concerns arise.  There are no Patient Instructions on file for this visit.   [employer]

## 2015-06-11 NOTE — Progress Notes (Signed)
Pre visit review using our clinic review tool, if applicable. No additional management support is needed unless otherwise documented below in the visit note. 

## 2015-08-22 ENCOUNTER — Other Ambulatory Visit: Payer: Self-pay | Admitting: Family

## 2015-08-25 NOTE — Telephone Encounter (Signed)
Patient requesting refill on zolpidem (Ambien) 10 mg tablet.

## 2015-08-25 NOTE — Telephone Encounter (Signed)
OK to refill for one month 

## 2015-08-26 NOTE — Telephone Encounter (Signed)
VERBAL CALL IN TO THE [facility]

## 2015-09-01 MED ORDER — ZOLPIDEM TARTRATE 10 MG PO TABS: 10.0000 mg | ORAL_TABLET | Freq: Every day | ORAL | Status: DC

## 2015-09-01 NOTE — Addendum Note (Signed)
Addended by: Colleen Can on: 09/01/2015 08:13 AM   Modules accepted: Orders

## 2015-09-25 ENCOUNTER — Other Ambulatory Visit: Payer: Self-pay | Admitting: Family

## 2015-10-17 DIAGNOSIS — H903 Sensorineural hearing loss, bilateral: Secondary | ICD-10-CM | POA: Diagnosis not present

## 2015-10-31 ENCOUNTER — Other Ambulatory Visit: Payer: Self-pay | Admitting: Adult Health

## 2015-11-03 NOTE — Telephone Encounter (Signed)
Pt last visit 06/11/15 Pt last Rx refill 09/01/15 #30 with 1 refill

## 2015-11-03 NOTE — Telephone Encounter (Signed)
Ok to refill for one month  

## 2015-11-21 DIAGNOSIS — H9190 Unspecified hearing loss, unspecified ear: Secondary | ICD-10-CM | POA: Diagnosis not present

## 2015-11-21 DIAGNOSIS — Z9621 Cochlear implant status: Secondary | ICD-10-CM | POA: Diagnosis not present

## 2015-11-21 DIAGNOSIS — H903 Sensorineural hearing loss, bilateral: Secondary | ICD-10-CM | POA: Diagnosis not present

## 2015-11-25 ENCOUNTER — Ambulatory Visit (INDEPENDENT_AMBULATORY_CARE_PROVIDER_SITE_OTHER): Payer: Medicare Other | Admitting: Adult Health

## 2015-11-25 ENCOUNTER — Encounter: Payer: Self-pay | Admitting: Adult Health

## 2015-11-25 VITALS — BP 140/100 | HR 73 | Temp 97.4°F | Wt 135.9 lb

## 2015-11-25 DIAGNOSIS — E7800 Pure hypercholesterolemia, unspecified: Secondary | ICD-10-CM

## 2015-11-25 DIAGNOSIS — R350 Frequency of micturition: Secondary | ICD-10-CM

## 2015-11-25 DIAGNOSIS — G4700 Insomnia, unspecified: Secondary | ICD-10-CM

## 2015-11-25 DIAGNOSIS — R5382 Chronic fatigue, unspecified: Secondary | ICD-10-CM | POA: Diagnosis not present

## 2015-11-25 DIAGNOSIS — I10 Essential (primary) hypertension: Secondary | ICD-10-CM | POA: Diagnosis not present

## 2015-11-25 LAB — LIPID PANEL
Cholesterol: 198 mg/dL (ref 0–200)
HDL: 83.7 mg/dL (ref >39.00)
LDL Cholesterol: 93 mg/dL (ref 0–99)
NonHDL: 114.02
Total CHOL/HDL Ratio: 2
Triglycerides: 105 mg/dL (ref 0.0–149.0)
VLDL: 21 mg/dL (ref 0.0–40.0)

## 2015-11-25 LAB — CBC WITH DIFFERENTIAL/PLATELET
Basophils Absolute: 0 10*3/uL (ref 0.0–0.1)
Basophils Relative: 0.5 % (ref 0.0–3.0)
EOS ABS: 0.1 10*3/uL (ref 0.0–0.7)
Eosinophils Relative: 1.9 % (ref 0.0–5.0)
HEMATOCRIT: 46.1 % (ref 39.0–52.0)
Hemoglobin: 15.6 g/dL (ref 13.0–17.0)
LYMPHS PCT: 15.7 % (ref 12.0–46.0)
Lymphs Abs: 0.9 10*3/uL (ref 0.7–4.0)
MCHC: 33.9 g/dL (ref 30.0–36.0)
MCV: 100.7 fl — ABNORMAL HIGH (ref 78.0–100.0)
Monocytes Absolute: 0.7 10*3/uL (ref 0.1–1.0)
Monocytes Relative: 12.8 % — ABNORMAL HIGH (ref 3.0–12.0)
NEUTROS ABS: 3.9 10*3/uL (ref 1.4–7.7)
Neutrophils Relative %: 69.1 % (ref 43.0–77.0)
PLATELETS: 198 10*3/uL (ref 150.0–400.0)
RBC: 4.58 Mil/uL (ref 4.22–5.81)
RDW: 13.1 % (ref 11.5–15.5)
WBC: 5.7 10*3/uL (ref 4.0–10.5)

## 2015-11-25 LAB — POCT URINALYSIS DIPSTICK
Bilirubin, UA: NEGATIVE
GLUCOSE UA: NEGATIVE
Leukocytes, UA: NEGATIVE
NITRITE UA: NEGATIVE
PH UA: 5.5
PROTEIN UA: NEGATIVE
UROBILINOGEN UA: 0.2

## 2015-11-25 LAB — BASIC METABOLIC PANEL
BUN: 17 mg/dL (ref 6–23)
CALCIUM: 9.9 mg/dL (ref 8.4–10.5)
CO2: 30 meq/L (ref 19–32)
CREATININE: 1.1 mg/dL (ref 0.40–1.50)
Chloride: 101 mEq/L (ref 96–112)
GFR: 69.53 mL/min (ref >60.00)
Glucose, Bld: 83 mg/dL (ref 70–99)
Potassium: 4.5 mEq/L (ref 3.5–5.1)
Sodium: 140 mEq/L (ref 135–145)

## 2015-11-25 LAB — PSA: PSA: 1.02 ng/mL (ref 0.10–4.00)

## 2015-11-25 LAB — TSH: TSH: 0.9 u[IU]/mL (ref 0.35–4.50)

## 2015-11-25 MED ORDER — ZOLPIDEM TARTRATE 10 MG PO TABS: 10.0000 mg | ORAL_TABLET | Freq: Every evening | ORAL | Status: DC | PRN

## 2015-11-25 MED ORDER — TRAZODONE HCL 50 MG PO TABS: 50.0000 mg | ORAL_TABLET | Freq: Every day | ORAL | Status: DC

## 2015-11-25 NOTE — Progress Notes (Signed)
Subjective:    Patient ID: Michael Miranda, male    DOB: [birth year], 74 y.o.   MRN: [id number]  HPI   74 year old male who presents to the office today for follow up of  His chronic disease issues of hypertension, and hyperlipidemia.  HTN - he reports that he has been monitoring his blood pressures at home on occasion, his blood pressures at home have been in the 120-130/70's. He denies any headaches, blurred vision or dizziness. He takes his medication every day. Patient endorses having a dry cough form time to time but does not know if it has been from the ACEI as " I have been taking it forever".    Hyperlipidemia - He is eating healthy and exercises. He has been on Zocor for many years and is wondering if it is time to switch to another medication. He has been reading about Livelo and since it has a lower side effect profile, he would like to know if it would be right for him to switch.   Frequent Urination - He endorses that over the past few months he has been experiencing more frequent urination. Sometimes throughout the day he has to urinate very hour. Denies any increase in urinary frequency at night. Denies other UTI symptoms.   Fatigue - Additionally he has been more fatigue. He does not know when he started to feel fatigued. He now has to take a short nap during the middle of the day, when he wakes up from this he feels better. Is not using ambian every night to help him sleep. Denies feeling acutely ill.   Review of Systems  Constitutional: Positive for fatigue. Negative for chills, diaphoresis and unexpected weight change.  HENT: Negative.   Eyes: Negative.   Respiratory: Positive for cough (dry chronic). Negative for choking, chest tightness, shortness of breath, wheezing and stridor.   Cardiovascular: Negative.   Gastrointestinal: Negative.   Genitourinary: Positive for frequency and decreased urine volume. Negative for dysuria, urgency, hematuria, flank pain, enuresis  and difficulty urinating.  Musculoskeletal: Negative.   Skin: Negative.   Neurological: Negative.   All other systems reviewed and are negative.  Past Medical History  Diagnosis Date  . Hyperlipidemia   . Hypertension   . Depression   . Alcohol abuse     10 years ago   . Kidney stones     20 years ago  . Arthritis     Social History   Social History  . Marital Status: Married    Spouse Name: N/A  . Number of Children: N/A  . Years of Education: N/A   Occupational History  . Not on file.   Social History Main Topics  . Smoking status: Never Smoker   . Smokeless tobacco: Not on file  . Alcohol Use: 0.0 oz/week    0 Standard drinks or equivalent per week     Comment: Wine with dinner  . Drug Use: No  . Sexual Activity: Not on file   Other Topics Concern  . 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     Past Surgical History  Procedure Laterality Date  . Hernia repair    . Cochlear implant      bilateral  Family History  Problem Relation Age of Onset  . Heart attack Brother   . Coronary artery disease Brother   . Cancer Father     lung  . Dementia Mother     No Known Allergies  Current Outpatient Prescriptions on File Prior to Visit  Medication Sig Dispense Refill  . aspirin EC 81 MG tablet Take 1 tablet (81 mg total) by mouth daily. 1 tablet 0  . benazepril-hydrochlorthiazide (LOTENSIN HCT) 20-12.5 MG tablet take 1 tablet by mouth once daily 90 tablet 1  . simvastatin (ZOCOR) 20 MG tablet Take 1 tablet (20 mg total) by mouth at bedtime. 30 tablet 3   No current facility-administered medications on file prior to visit.    BP 140/100 mmHg  Pulse 73  Temp(Src) 97.4 F (36.3 C) (Oral)  Wt 135 lb 14.4 oz (61.644 kg)       Objective:   Physical Exam  Constitutional: He is  oriented to person, place, and time. He appears well-developed and well-nourished. No distress.  HENT:  Head: Normocephalic and atraumatic.  Right Ear: External ear normal.  Left Ear: External ear normal.  Nose: Nose normal.  Mouth/Throat: Oropharynx is clear and moist. No oropharyngeal exudate.  Cochlear implants ( bilateral)  Eyes: Conjunctivae and EOM are normal. Pupils are equal, round, and reactive to light. Right eye exhibits no discharge. Left eye exhibits no discharge. No scleral icterus.  Cardiovascular: Normal rate, regular rhythm, normal heart sounds and intact distal pulses.  Exam reveals no gallop and no friction rub.   No murmur heard. Pulmonary/Chest: Effort normal and breath sounds normal. No respiratory distress. He has no wheezes. He has no rales. He exhibits no tenderness.  Abdominal: Soft. Bowel sounds are normal.  Genitourinary: Rectum normal, prostate normal and penis normal. Guaiac negative stool. No penile tenderness.  Prostate smooth and symmetrical. Slightly enlarged.   Musculoskeletal: Normal range of motion. He exhibits no edema or tenderness.  Neurological: He is alert and oriented to person, place, and time.  Skin: Skin is warm and dry. No rash noted. He is not diaphoretic. No erythema. No pallor.  Psychiatric: He has a normal mood and affect. His behavior is normal. Judgment and thought content normal.  Nursing note and vitals reviewed.     Assessment & Plan:  1. Essential hypertension - Monitor blood pressures over the next week and report to me. Will consider changing HTN med  - Basic metabolic panel - CBC with Differential/Platelet - Lipid panel - POCT urinalysis dipstick  2. Hypercholesteremia - Basic metabolic panel - CBC with Differential/Platelet - Lipid panel - TSH - PSA  3. Insomnia - zolpidem (AMBIEN) 10 MG tablet; Take 1 tablet (10 mg total) by mouth at bedtime as needed for sleep.  Dispense: 30 tablet; Refill: 0  4. Chronic fatigue -  Anemia vs hypothyroid vs viral illness -TSH - Basic metabolic panel -CBC 5. Frequent urination - BPH vs OAB - POCT urinalysis dipstick - PSA - prostate exam was unremarkable except for slightly enlarged prostate - Consider medication and/or urology referral.

## 2015-11-25 NOTE — Progress Notes (Signed)
Pre visit review using our clinic review tool, if applicable. No additional management support is needed unless otherwise documented below in the visit note. 

## 2015-11-25 NOTE — Patient Instructions (Signed)
It was great seeing you today!  I will follow up with you regarding your labs and then we can discuss changing medications for cholesterol.   Monitor your blood pressure at home and let me know what they are in a week.   If you need anything, please let me know  I hope you have a happy holiday season.

## 2015-11-26 ENCOUNTER — Encounter: Payer: Self-pay | Admitting: Adult Health

## 2015-11-26 DIAGNOSIS — H903 Sensorineural hearing loss, bilateral: Secondary | ICD-10-CM | POA: Diagnosis not present

## 2015-12-02 ENCOUNTER — Encounter: Payer: Self-pay | Admitting: Adult Health

## 2015-12-03 ENCOUNTER — Encounter: Payer: Self-pay | Admitting: *Deleted

## 2015-12-03 ENCOUNTER — Other Ambulatory Visit: Payer: Self-pay | Admitting: Adult Health

## 2015-12-03 MED ORDER — TOLTERODINE TARTRATE ER 2 MG PO CP24: 2.0000 mg | ORAL_CAPSULE | Freq: Every day | ORAL | Status: DC

## 2015-12-24 DIAGNOSIS — L570 Actinic keratosis: Secondary | ICD-10-CM | POA: Diagnosis not present

## 2015-12-24 DIAGNOSIS — Z23 Encounter for immunization: Secondary | ICD-10-CM | POA: Diagnosis not present

## 2015-12-24 DIAGNOSIS — L821 Other seborrheic keratosis: Secondary | ICD-10-CM | POA: Diagnosis not present

## 2015-12-24 DIAGNOSIS — R229 Localized swelling, mass and lump, unspecified: Secondary | ICD-10-CM | POA: Diagnosis not present

## 2015-12-25 DIAGNOSIS — D3142 Benign neoplasm of left ciliary body: Secondary | ICD-10-CM | POA: Diagnosis not present

## 2015-12-25 DIAGNOSIS — D3141 Benign neoplasm of right ciliary body: Secondary | ICD-10-CM | POA: Diagnosis not present

## 2016-01-05 ENCOUNTER — Other Ambulatory Visit: Payer: Self-pay | Admitting: Adult Health

## 2016-02-06 ENCOUNTER — Other Ambulatory Visit: Payer: Self-pay | Admitting: Adult Health

## 2016-02-06 NOTE — Telephone Encounter (Signed)
Rx called in as directed.   

## 2016-02-06 NOTE — Telephone Encounter (Signed)
Ok to refill for one month  

## 2016-02-20 ENCOUNTER — Other Ambulatory Visit: Payer: Self-pay | Admitting: Adult Health

## 2016-03-25 ENCOUNTER — Other Ambulatory Visit: Payer: Self-pay | Admitting: Adult Health

## 2016-03-25 NOTE — Telephone Encounter (Signed)
Ok to refill 

## 2016-03-25 NOTE — Telephone Encounter (Signed)
Ok to refill for one month  

## 2016-04-04 ENCOUNTER — Encounter: Payer: Self-pay | Admitting: Adult Health

## 2016-04-06 ENCOUNTER — Other Ambulatory Visit: Payer: Self-pay | Admitting: Adult Health

## 2016-04-06 MED ORDER — SIMVASTATIN 10 MG PO TABS: 10.0000 mg | ORAL_TABLET | Freq: Every day | ORAL | Status: DC

## 2016-04-22 DIAGNOSIS — R229 Localized swelling, mass and lump, unspecified: Secondary | ICD-10-CM | POA: Diagnosis not present

## 2016-04-22 DIAGNOSIS — D225 Melanocytic nevi of trunk: Secondary | ICD-10-CM | POA: Diagnosis not present

## 2016-04-22 DIAGNOSIS — L821 Other seborrheic keratosis: Secondary | ICD-10-CM | POA: Diagnosis not present

## 2016-04-22 DIAGNOSIS — L570 Actinic keratosis: Secondary | ICD-10-CM | POA: Diagnosis not present

## 2016-05-26 ENCOUNTER — Other Ambulatory Visit: Payer: Self-pay | Admitting: Adult Health

## 2016-05-27 NOTE — Telephone Encounter (Signed)
Ok to refill Lotensin 90 pills, 1 refill.  OK to refill Ambien, 30 pills, 0 refills

## 2016-05-28 NOTE — Telephone Encounter (Signed)
Lotensin sent in & Ambien called in.

## 2016-07-20 DIAGNOSIS — S0181XA Laceration without foreign body of other part of head, initial encounter: Secondary | ICD-10-CM | POA: Diagnosis not present

## 2016-07-21 ENCOUNTER — Other Ambulatory Visit: Payer: Self-pay | Admitting: Adult Health

## 2016-07-21 NOTE — Telephone Encounter (Signed)
Ok to refill 

## 2016-07-22 NOTE — Progress Notes (Signed)
Subjective:   Michael Miranda is a 75 y.o. male who presents for Medicare Annual/Subsequent preventive examination.  The Patient was informed that the wellness visit is to identify future health risk and educate and initiate measures that can reduce risk for increased disease through the lifespan.    NO ROS; Medicare Wellness Visit  Psychosocial: no HD; Lung cancer and mother had stroke Tobacco: never smoked ETOH;  Glass of wine; occasional  HDL 83 and Trilg 105; / ratio 2;  (fasting 83)  Medications reviewed for issues; compliance; otc meds - simvastatin just reduced  Enjoys traveling and reading;  Retired form [employer]; disability   HTN; Moderate last reading  BMI: 20  Recommended he had a protein to diet Maybe protein shake in the am  Diet Underweight; weight has dropped Appetite is good Does not eat beef a lot No meat except once a day Likes vegetables    Exercise;   Walks briskly; 2 miles 5 to 6 days a week 2013 and 2014; had chest pressure while walking; Stress was okl Was to go back and do the stress test in 2 years ;   Meds; takes [place] 1/2 pill to help with sleep Bed 10;30 and sometimes wakes up at 12 or 1; then he takes [place];  May sleep unitl 6am   HOME SAFETY Long term plan reviewed  Has moved; one level  Home: level; barriers; or needs identified as bathroom railing or other review;  Fall hx; no/ tripped at hs; it was dark, now has night lights  Some some ladders;  Given education on "Fall Prevention in the Home" for more safety tips the patient can apply as appropriate.   Personal safety issues reviewed:  1.  for risk such as safe community 2.  smoke detector 3.  firearms safety if applicable  4. protection when in the sun; yes 5. Watches skin; has elevated area left forehead but dermatologist is watching; feels like it is bone  6. driving safety for seniors or any recent accidents. No    Risk for Depression reviewed: Any emotional problems?  Anxious, depressed, irritable, sad or blue? Sometimes Not disabling; stopped antidepressants Stays socially engaged; walks and reads  Denies feeling depressed or hopeless; voices pleasure in daily life How many social activities have you been engaged in within the last 2 weeks? No Depression was 5 to 10 years ago and not since  Likes to travel; went to [place]; memory is not like it used to be  [employer], medications; no failures of task Ad8 score reviewed for issues;  Issues making decisions; no  Less interest in hobbies / activities" no  Repeats questions, stories; family complaining: NO  Trouble using ordinary gadgets; microwave; computer: no  Forgets the month or year: no  Mismanaging finances: no  Missing apt: no but does write them down  Daily problems with thinking of memory NO Ad8 score is 0  MMSE not appropriate unless AD8 score is > 2   Advanced Directive addressed; yes   Counseling Health Maintenance Gaps: Shingles; updated; had at [place] practice  PSV 23 at 46 when he had last cocklear implant / Prevnar taken 01/15/15 Has taken  Both and added to the historical record; Vaccines complete at present   Colonoscopy; 07/2019 EKG: 04/2015 Prostate cancer screening: 11/2014  Hearing: has cochlear implants bilateral   Ophthalmology exam// once a year Last eye exam August; eye exam due Dr. Jabier Mutton   Immunizations Due: (  Vaccines reviewed and educated regarding any overdue)  Flu shot when available   Established and updated Risk reviewed and appropriate referral made or health recommendations:  Current Care Team reviewed and updated       Objective:    Vitals: There were no vitals taken for this visit.  There is no height or weight on file to calculate BMI.  Tobacco History  Smoking Status  . Never Smoker  Smokeless Tobacco  . Not on file     Counseling given: Not Answered   Past Medical History:  Diagnosis Date    . Alcohol abuse    10 years ago   . Arthritis   . Depression   . Hyperlipidemia   . Hypertension   . Kidney stones    20 years ago   Past Surgical History:  Procedure Laterality Date  . COCHLEAR IMPLANT     bilateral  . HERNIA REPAIR     Family History  Problem Relation Age of Onset  . Heart attack Brother   . Coronary artery disease Brother   . Cancer Father     lung  . Dementia Mother    History  Sexual Activity  . Sexual activity: Not on file    Outpatient Encounter Prescriptions as of 07/23/2016  Medication Sig  . aspirin EC 81 MG tablet Take 1 tablet (81 mg total) by mouth daily.  . benazepril-hydrochlorthiazide (LOTENSIN HCT) 20-12.5 MG tablet take 1 tablet by mouth once daily  . simvastatin (ZOCOR) 10 MG tablet Take 1 tablet (10 mg total) by mouth at bedtime.  . tolterodine (DETROL LA) 2 MG 24 hr capsule Take 1 capsule (2 mg total) by mouth daily.  . traZODone (DESYREL) 50 MG tablet Take 1 tablet (50 mg total) by mouth at bedtime.  Marland Kitchen zolpidem (AMBIEN) 10 MG tablet take 1 tablet by mouth at bedtime for sleep   No facility-administered encounter medications on file as of 07/23/2016.     Activities of Daily Living No flowsheet data found.  Patient Care Team: Dorothyann Peng, NP as PCP - General (Family Medicine)   Assessment:     Exercise Activities and Dietary recommendations    Goals    None     Fall Risk Fall Risk  11/27/2014 10/01/2013  Falls in the past year? No No   Depression Screen PHQ 2/9 Scores 11/27/2014 10/01/2013  PHQ - 2 Score 0 1  PHQ- 9 Score - 3    Cognitive Testing No flowsheet data found.  Immunization History  Administered Date(s) Administered  . Influenza Split 09/18/2012  . Influenza, High Dose Seasonal PF 10/01/2013, 09/18/2015  . Pneumococcal Conjugate-13 01/15/2015  . Td 11/27/2014   Screening Tests Health Maintenance  Topic Date Due  . ZOSTAVAX  10/09/2001  . PNA vac Low Risk Adult (2 of 2 - PPSV23) 01/16/2016   . INFLUENZA VACCINE  07/06/2016  . COLONOSCOPY  07/09/2019  . TETANUS/TDAP  11/27/2024      Plan:     Will call Dr. Burt Knack and schedule stress test   Will bring HCPOA to next office visit  Your vaccines have been updated   During the course of the visit the patient was educated and counseled about the following appropriate screening and preventive services:   Vaccines to include Pneumoccal, Influenza, Hepatitis B, Td, Zostavax, HCV  Electrocardiogram  Cardiovascular Disease  Colorectal cancer screening/   Diabetes screening  Prostate Cancer Screening  Glaucoma screening- to schedule eye exam  Nutrition counseling  Smoking cessation counseling  Patient Instructions (the written plan) was given to the patient.    Wynetta Fines, RN  07/22/2016

## 2016-07-22 NOTE — Telephone Encounter (Signed)
Rx called in as directed.   

## 2016-07-23 ENCOUNTER — Ambulatory Visit (INDEPENDENT_AMBULATORY_CARE_PROVIDER_SITE_OTHER): Payer: Medicare Other

## 2016-07-23 VITALS — BP 130/70 | HR 71 | Ht 68.0 in | Wt 133.0 lb

## 2016-07-23 DIAGNOSIS — Z0000 Encounter for general adult medical examination without abnormal findings: Secondary | ICD-10-CM

## 2016-07-23 NOTE — Patient Instructions (Addendum)
Mr. Kesselring , Thank you for taking time to come for your Medicare Wellness Visit. I appreciate your ongoing commitment to your health goals. Please review the following plan we discussed and let me know if I can assist you in the future.   Will call Dr. Burt Knack and schedule stress test   Will bring HCPOA to next office visit  Your vaccines have been updated   Falls can be the main reason people lose their independence Think before you CLIMB  4 things you can do to prevent falls 1. Begin an exercise program to improve your leg strength and balance 2. Ask the doctor to review medicines for fall risk 3. Get an annual eye check up and update your eye glasses;  ([employer] still assist with eyewear:  Reviewed for annual vision exam;[facility] assistance for eyewear is coordinated through [facility]; Please call Cherlyn Labella at [phone] 4. Make your home safer by: Removing clutter and tripping hazzards Putting railing on stairs and adding grab bars to the bathroom  Have good lighting; especially on stairs and at night when getting up to the bathroom   Exercise; including walking can assist with maintaining tone and balance and help prevent falls as you age.      These are the goals we discussed: Goals    . patient          To get out more more; Perhaps join a club;        This is a list of the screening recommended for you and due dates:  Health Maintenance  Topic Date Due  . Shingles Vaccine  10/09/2001  . Pneumonia vaccines (2 of 2 - PPSV23) 01/16/2016  . Flu Shot  07/06/2016  . Colon Cancer Screening  07/09/2019  . Tetanus Vaccine  11/27/2024       Fall Prevention in the Home  Falls can cause injuries. They can happen to people of all ages. There are many things you can do to make your home safe and to help prevent falls.  WHAT CAN I DO ON THE OUTSIDE OF MY HOME?  Regularly fix the edges of walkways and driveways and fix any cracks.  Remove anything that might  make you trip as you walk through a door, such as a raised step or threshold.  Trim any bushes or trees on the path to your home.  Use bright outdoor lighting.  Clear any walking paths of anything that might make someone trip, such as rocks or tools.  Regularly check to see if handrails are loose or broken. Make sure that both sides of any steps have handrails.  Any raised decks and porches should have guardrails on the edges.  Have any leaves, snow, or ice cleared regularly.  Use sand or salt on walking paths during winter.  Clean up any spills in your garage right away. This includes oil or grease spills. WHAT CAN I DO IN THE BATHROOM?   Use night lights.  Install grab bars by the toilet and in the tub and shower. Do not use towel bars as grab bars.  Use non-skid mats or decals in the tub or shower.  If you need to sit down in the shower, use a plastic, non-slip stool.  Keep the floor dry. Clean up any water that spills on the floor as soon as it happens.  Remove soap buildup in the tub or shower regularly.  Attach bath mats securely with double-sided non-slip rug tape.  Do not have  throw rugs and other things on the floor that can make you trip. WHAT CAN I DO IN THE BEDROOM?  Use night lights.  Make sure that you have a light by your bed that is easy to reach.  Do not use any sheets or blankets that are too big for your bed. They should not hang down onto the floor.  Have a firm chair that has side arms. You can use this for support while you get dressed.  Do not have throw rugs and other things on the floor that can make you trip. WHAT CAN I DO IN THE KITCHEN?  Clean up any spills right away.  Avoid walking on wet floors.  Keep items that you use a lot in easy-to-reach places.  If you need to reach something above you, use a strong step stool that has a grab bar.  Keep electrical cords out of the way.  Do not use floor polish or wax that makes floors  slippery. If you must use wax, use non-skid floor wax.  Do not have throw rugs and other things on the floor that can make you trip. WHAT CAN I DO WITH MY STAIRS?  Do not leave any items on the stairs.  Make sure that there are handrails on both sides of the stairs and use them. Fix handrails that are broken or loose. Make sure that handrails are as long as the stairways.  Check any carpeting to make sure that it is firmly attached to the stairs. Fix any carpet that is loose or worn.  Avoid having throw rugs at the top or bottom of the stairs. If you do have throw rugs, attach them to the floor with carpet tape.  Make sure that you have a light switch at the top of the stairs and the bottom of the stairs. If you do not have them, ask someone to add them for you. WHAT ELSE CAN I DO TO HELP PREVENT FALLS?  Wear shoes that:  Do not have high heels.  Have rubber bottoms.  Are comfortable and fit you well.  Are closed at the toe. Do not wear sandals.  If you use a stepladder:  Make sure that it is fully opened. Do not climb a closed stepladder.  Make sure that both sides of the stepladder are locked into place.  Ask someone to hold it for you, if possible.  Clearly mark and make sure that you can see:  Any grab bars or handrails.  First and last steps.  Where the edge of each step is.  Use tools that help you move around (mobility aids) if they are needed. These include:  Canes.  Walkers.  Scooters.  Crutches.  Turn on the lights when you go into a dark area. Replace any light bulbs as soon as they burn out.  Set up your furniture so you have a clear path. Avoid moving your furniture around.  If any of your floors are uneven, fix them.  If there are any pets around you, be aware of where they are.  Review your medicines with your doctor. Some medicines can make you feel dizzy. This can increase your chance of falling. Ask your doctor what other things that you  can do to help prevent falls.   This information is not intended to replace advice given to you by your health care provider. Make sure you discuss any questions you have with your health care provider.   Document Released: 09/18/2009 Document  Revised: 04/08/2015 Document Reviewed: 12/27/2014 Elsevier Interactive Patient Education 2016 [place] Maintenance, Male A healthy lifestyle and preventative care can promote health and wellness.  Maintain regular health, dental, and eye exams.  Eat a healthy diet. Foods like vegetables, fruits, whole grains, low-fat dairy products, and lean protein foods contain the nutrients you need and are low in calories. Decrease your intake of foods high in solid fats, added sugars, and salt. Get information about a proper diet from your health care provider, if necessary.  Regular physical exercise is one of the most important things you can do for your health. Most adults should get at least 150 minutes of moderate-intensity exercise (any activity that increases your heart rate and causes you to sweat) each week. In addition, most adults need muscle-strengthening exercises on 2 or more days a week.   Maintain a healthy weight. The body mass index (BMI) is a screening tool to identify possible weight problems. It provides an estimate of body fat based on height and weight. Your health care provider can find your BMI and can help you achieve or maintain a healthy weight. For males 20 years and older:  A BMI below 18.5 is considered underweight.  A BMI of 18.5 to 24.9 is normal.  A BMI of 25 to 29.9 is considered overweight.  A BMI of 30 and above is considered obese.  Maintain normal blood lipids and cholesterol by exercising and minimizing your intake of saturated fat. Eat a balanced diet with plenty of fruits and vegetables. Blood tests for lipids and cholesterol should begin at age 53 and be repeated every 5 years. If your lipid or cholesterol  levels are high, you are over age 80, or you are at high risk for heart disease, you may need your cholesterol levels checked more frequently.Ongoing high lipid and cholesterol levels should be treated with medicines if diet and exercise are not working.  If you smoke, find out from your health care provider how to quit. If you do not use tobacco, do not start.  Lung cancer screening is recommended for adults aged 61-80 years who are at high risk for developing lung cancer because of a history of smoking. A yearly low-dose CT scan of the lungs is recommended for people who have at least a 30-pack-year history of smoking and are current smokers or have quit within the past 15 years. A pack year of smoking is smoking an average of 1 pack of cigarettes a day for 1 year (for example, a 30-pack-year history of smoking could mean smoking 1 pack a day for 30 years or 2 packs a day for 15 years). Yearly screening should continue until the smoker has stopped smoking for at least 15 years. Yearly screening should be stopped for people who develop a health problem that would prevent them from having lung cancer treatment.  If you choose to drink alcohol, do not have more than 2 drinks per day. One drink is considered to be 12 oz (360 mL) of beer, 5 oz (150 mL) of wine, or 1.5 oz (45 mL) of liquor.  Avoid the use of street drugs. Do not share needles with anyone. Ask for help if you need support or instructions about stopping the use of drugs.  High blood pressure causes heart disease and increases the risk of stroke. High blood pressure is more likely to develop in:  People who have blood pressure in the end of the normal range (100-139/85-89 mm Hg).  People who are overweight or obese.  People who are African American.  If you are 7-18 years of age, have your blood pressure checked every 3-5 years. If you are 54 years of age or older, have your blood pressure checked every year. You should have your blood  pressure measured twice--once when you are at a hospital or clinic, and once when you are not at a hospital or clinic. Record the average of the two measurements. To check your blood pressure when you are not at a hospital or clinic, you can use:  An automated blood pressure machine at a pharmacy.  A home blood pressure monitor.  If you are 55-54 years old, ask your health care provider if you should take aspirin to prevent heart disease.  Diabetes screening involves taking a blood sample to check your fasting blood sugar level. This should be done once every 3 years after age 54 if you are at a normal weight and without risk factors for diabetes. Testing should be considered at a younger age or be carried out more frequently if you are overweight and have at least 1 risk factor for diabetes.  Colorectal cancer can be detected and often prevented. Most routine colorectal cancer screening begins at the age of 67 and continues through age 102. However, your health care provider may recommend screening at an earlier age if you have risk factors for colon cancer. On a yearly basis, your health care provider may provide home test kits to check for hidden blood in the stool. A small camera at the end of a tube may be used to directly examine the colon (sigmoidoscopy or colonoscopy) to detect the earliest forms of colorectal cancer. Talk to your health care provider about this at age 16 when routine screening begins. A direct exam of the colon should be repeated every 5-10 years through age 29, unless early forms of precancerous polyps or small growths are found.  People who are at an increased risk for hepatitis B should be screened for this virus. You are considered at high risk for hepatitis B if:  You were born in a country where hepatitis B occurs often. Talk with your health care provider about which countries are considered high risk.  Your parents were born in a high-risk country and you have not  received a shot to protect against hepatitis B (hepatitis B vaccine).  You have HIV or AIDS.  You use needles to inject street drugs.  You live with, or have sex with, someone who has hepatitis B.  You are a man who has sex with other men (MSM).  You get hemodialysis treatment.  You take certain medicines for conditions like cancer, organ transplantation, and autoimmune conditions.  Hepatitis C blood testing is recommended for all people born from [birth year] through 1965 and any individual with known risk factors for hepatitis C.  Healthy men should no longer receive prostate-specific antigen (PSA) blood tests as part of routine cancer screening. Talk to your health care provider about prostate cancer screening.  Testicular cancer screening is not recommended for adolescents or adult males who have no symptoms. Screening includes self-exam, a health care provider exam, and other screening tests. Consult with your health care provider about any symptoms you have or any concerns you have about testicular cancer.  Practice safe sex. Use condoms and avoid high-risk sexual practices to reduce the spread of sexually transmitted infections (STIs).  You should be screened for STIs, including gonorrhea and chlamydia if:  You are sexually active and are younger than 24 years.  You are older than 24 years, and your health care provider tells you that you are at risk for this type of infection.  Your sexual activity has changed since you were last screened, and you are at an increased risk for chlamydia or gonorrhea. Ask your health care provider if you are at risk.  If you are at risk of being infected with HIV, it is recommended that you take a prescription medicine daily to prevent HIV infection. This is called pre-exposure prophylaxis (PrEP). You are considered at risk if:  You are a man who has sex with other men (MSM).  You are a heterosexual man who is sexually active with multiple  partners.  You take drugs by injection.  You are sexually active with a partner who has HIV.  Talk with your health care provider about whether you are at high risk of being infected with HIV. If you choose to begin PrEP, you should first be tested for HIV. You should then be tested every 3 months for as long as you are taking PrEP.  Use sunscreen. Apply sunscreen liberally and repeatedly throughout the day. You should seek shade when your shadow is shorter than you. Protect yourself by wearing long sleeves, pants, a wide-brimmed hat, and sunglasses year round whenever you are outdoors.  Tell your health care provider of new moles or changes in moles, especially if there is a change in shape or color. Also, tell your health care provider if a mole is larger than the size of a pencil eraser.  A one-time screening for abdominal aortic aneurysm (AAA) and surgical repair of large AAAs by ultrasound is recommended for men aged 40-75 years who are current or former smokers.  Stay current with your vaccines (immunizations).   This information is not intended to replace advice given to you by your health care provider. Make sure you discuss any questions you have with your health care provider.   Document Released: 05/20/2008 Document Revised: 12/13/2014 Document Reviewed: 04/19/2011 Elsevier Interactive Patient Education [employer].

## 2016-07-23 NOTE — Progress Notes (Signed)
I agree with this plan of care 

## 2016-07-25 ENCOUNTER — Encounter: Payer: Self-pay | Admitting: Adult Health

## 2016-07-27 ENCOUNTER — Telehealth: Payer: Self-pay | Admitting: Family Medicine

## 2016-07-27 NOTE — Telephone Encounter (Signed)
PA has been approved.  Pt needs to be notified.  Left a message for a return call.

## 2016-07-27 NOTE — Telephone Encounter (Signed)
Prior authorization submitted to Cover My Meds.  Waiting on decision

## 2016-07-28 NOTE — Telephone Encounter (Signed)
Left a message on home phone for a return call.

## 2016-07-30 NOTE — Telephone Encounter (Signed)
Left a message for a return call.  Have made multiple attempts to reach the pt.  Will now close the note. 

## 2016-09-16 ENCOUNTER — Telehealth: Payer: Self-pay

## 2016-09-16 NOTE — Telephone Encounter (Signed)
Rec'd a direct call from Michael Miranda who is looking for his 25.00 rewards card from medicare.  Referred him to Medicare but did tell him that to my knowledge, Medicare pays this reward on a quarterly basis and his exam was in the end of 3rd quarter, so he should be rec'ing something soon; Otherwise, directed him to call medicare  Or the number on the back of his medicare card   Will call if he has further issues. He did not have the letter he had rec'd, was not sure who it came from but has Medicare and BCBS as a Supplement; with [employer] plan

## 2016-09-28 DIAGNOSIS — H903 Sensorineural hearing loss, bilateral: Secondary | ICD-10-CM | POA: Diagnosis not present

## 2016-10-04 ENCOUNTER — Other Ambulatory Visit: Payer: Self-pay | Admitting: Adult Health

## 2016-10-04 NOTE — Telephone Encounter (Signed)
Ok to refill for one month  

## 2016-10-05 NOTE — Telephone Encounter (Signed)
Rx called in as directed.   

## 2016-11-06 DIAGNOSIS — H903 Sensorineural hearing loss, bilateral: Secondary | ICD-10-CM | POA: Diagnosis not present

## 2016-11-18 ENCOUNTER — Ambulatory Visit (INDEPENDENT_AMBULATORY_CARE_PROVIDER_SITE_OTHER): Payer: Medicare Other | Admitting: Adult Health

## 2016-11-18 ENCOUNTER — Encounter: Payer: Self-pay | Admitting: Adult Health

## 2016-11-18 VITALS — BP 128/82 | HR 68 | Temp 97.4°F | Ht 68.0 in | Wt 141.6 lb

## 2016-11-18 DIAGNOSIS — E7800 Pure hypercholesterolemia, unspecified: Secondary | ICD-10-CM | POA: Diagnosis not present

## 2016-11-18 DIAGNOSIS — G4700 Insomnia, unspecified: Secondary | ICD-10-CM | POA: Diagnosis not present

## 2016-11-18 DIAGNOSIS — I10 Essential (primary) hypertension: Secondary | ICD-10-CM

## 2016-11-18 LAB — LIPID PANEL
CHOL/HDL RATIO: 2
CHOLESTEROL: 204 mg/dL — AB (ref 0–200)
HDL: 90 mg/dL (ref >39.00)
LDL CALC: 100 mg/dL — AB (ref 0–99)
NonHDL: 114.12
TRIGLYCERIDES: 69 mg/dL (ref 0.0–149.0)
VLDL: 13.8 mg/dL (ref 0.0–40.0)

## 2016-11-18 LAB — TSH: TSH: 0.91 u[IU]/mL (ref 0.35–4.50)

## 2016-11-18 LAB — POC URINALSYSI DIPSTICK (AUTOMATED)
Bilirubin, UA: NEGATIVE
Glucose, UA: NEGATIVE
Ketones, UA: NEGATIVE
NITRITE UA: NEGATIVE
PROTEIN UA: NEGATIVE
RBC UA: NEGATIVE
SPEC GRAV UA: 1.01
UROBILINOGEN UA: 0.2
pH, UA: 7

## 2016-11-18 LAB — HEPATIC FUNCTION PANEL
ALT: 16 U/L (ref 0–53)
AST: 24 U/L (ref 0–37)
Albumin: 4.4 g/dL (ref 3.5–5.2)
Alkaline Phosphatase: 83 U/L (ref 39–117)
BILIRUBIN DIRECT: 0.2 mg/dL (ref 0.0–0.3)
BILIRUBIN TOTAL: 0.8 mg/dL (ref 0.2–1.2)
Total Protein: 6.7 g/dL (ref 6.0–8.3)

## 2016-11-18 LAB — BASIC METABOLIC PANEL
BUN: 14 mg/dL (ref 6–23)
CO2: 31 mEq/L (ref 19–32)
CREATININE: 1 mg/dL (ref 0.40–1.50)
Calcium: 9.1 mg/dL (ref 8.4–10.5)
Chloride: 98 mEq/L (ref 96–112)
GFR: 77.4 mL/min (ref >60.00)
GLUCOSE: 89 mg/dL (ref 70–99)
POTASSIUM: 3.8 meq/L (ref 3.5–5.1)
Sodium: 136 mEq/L (ref 135–145)

## 2016-11-18 LAB — PSA: PSA: 0.84 ng/mL (ref 0.10–4.00)

## 2016-11-18 MED ORDER — BENAZEPRIL HCL 40 MG PO TABS: 40.0000 mg | ORAL_TABLET | Freq: Every day | ORAL | 1 refills | Status: DC

## 2016-11-18 MED ORDER — ZOLPIDEM TARTRATE 10 MG PO TABS: ORAL_TABLET | ORAL | 0 refills | Status: DC

## 2016-11-18 NOTE — Patient Instructions (Signed)
It was great seeing you to day  Your exam was great!   I will follow up with you regarding your blood work.   Continue to stay active and eat healthy.   Follow up in one year  Health Maintenance, Male A healthy lifestyle and preventative care can promote health and wellness.  Maintain regular health, dental, and eye exams.  Eat a healthy diet. Foods like vegetables, fruits, whole grains, low-fat dairy products, and lean protein foods contain the nutrients you need and are low in calories. Decrease your intake of foods high in solid fats, added sugars, and salt. Get information about a proper diet from your health care provider, if necessary.  Regular physical exercise is one of the most important things you can do for your health. Most adults should get at least 150 minutes of moderate-intensity exercise (any activity that increases your heart rate and causes you to sweat) each week. In addition, most adults need muscle-strengthening exercises on 2 or more days a week.   Maintain a healthy weight. The body mass index (BMI) is a screening tool to identify possible weight problems. It provides an estimate of body fat based on height and weight. Your health care provider can find your BMI and can help you achieve or maintain a healthy weight. For males 20 years and older:  A BMI below 18.5 is considered underweight.  A BMI of 18.5 to 24.9 is normal.  A BMI of 25 to 29.9 is considered overweight.  A BMI of 30 and above is considered obese.  Maintain normal blood lipids and cholesterol by exercising and minimizing your intake of saturated fat. Eat a balanced diet with plenty of fruits and vegetables. Blood tests for lipids and cholesterol should begin at age 24 and be repeated every 5 years. If your lipid or cholesterol levels are high, you are over age 67, or you are at high risk for heart disease, you may need your cholesterol levels checked more frequently.Ongoing high lipid and  cholesterol levels should be treated with medicines if diet and exercise are not working.  If you smoke, find out from your health care provider how to quit. If you do not use tobacco, do not start.  Lung cancer screening is recommended for adults aged 67-80 years who are at high risk for developing lung cancer because of a history of smoking. A yearly low-dose CT scan of the lungs is recommended for people who have at least a 30-pack-year history of smoking and are current smokers or have quit within the past 15 years. A pack year of smoking is smoking an average of 1 pack of cigarettes a day for 1 year (for example, a 30-pack-year history of smoking could mean smoking 1 pack a day for 30 years or 2 packs a day for 15 years). Yearly screening should continue until the smoker has stopped smoking for at least 15 years. Yearly screening should be stopped for people who develop a health problem that would prevent them from having lung cancer treatment.  If you choose to drink alcohol, do not have more than 2 drinks per day. One drink is considered to be 12 oz (360 mL) of beer, 5 oz (150 mL) of wine, or 1.5 oz (45 mL) of liquor.  Avoid the use of street drugs. Do not share needles with anyone. Ask for help if you need support or instructions about stopping the use of drugs.  High blood pressure causes heart disease and increases the risk  of stroke. High blood pressure is more likely to develop in:  People who have blood pressure in the end of the normal range (100-139/85-89 mm Hg).  People who are overweight or obese.  People who are African American.  If you are 48-69 years of age, have your blood pressure checked every 3-5 years. If you are 62 years of age or older, have your blood pressure checked every year. You should have your blood pressure measured twice--once when you are at a hospital or clinic, and once when you are not at a hospital or clinic. Record the average of the two measurements. To  check your blood pressure when you are not at a hospital or clinic, you can use:  An automated blood pressure machine at a pharmacy.  A home blood pressure monitor.  If you are 1-31 years old, ask your health care provider if you should take aspirin to prevent heart disease.  Diabetes screening involves taking a blood sample to check your fasting blood sugar level. This should be done once every 3 years after age 32 if you are at a normal weight and without risk factors for diabetes. Testing should be considered at a younger age or be carried out more frequently if you are overweight and have at least 1 risk factor for diabetes.  Colorectal cancer can be detected and often prevented. Most routine colorectal cancer screening begins at the age of 32 and continues through age 54. However, your health care provider may recommend screening at an earlier age if you have risk factors for colon cancer. On a yearly basis, your health care provider may provide home test kits to check for hidden blood in the stool. A small camera at the end of a tube may be used to directly examine the colon (sigmoidoscopy or colonoscopy) to detect the earliest forms of colorectal cancer. Talk to your health care provider about this at age 53 when routine screening begins. A direct exam of the colon should be repeated every 5-10 years through age 61, unless early forms of precancerous polyps or small growths are found.  People who are at an increased risk for hepatitis B should be screened for this virus. You are considered at high risk for hepatitis B if:  You were born in a country where hepatitis B occurs often. Talk with your health care provider about which countries are considered high risk.  Your parents were born in a high-risk country and you have not received a shot to protect against hepatitis B (hepatitis B vaccine).  You have HIV or AIDS.  You use needles to inject street drugs.  You live with, or have sex  with, someone who has hepatitis B.  You are a man who has sex with other men (MSM).  You get hemodialysis treatment.  You take certain medicines for conditions like cancer, organ transplantation, and autoimmune conditions.  Hepatitis C blood testing is recommended for all people born from [birth year] through 1965 and any individual with known risk factors for hepatitis C.  Healthy men should no longer receive prostate-specific antigen (PSA) blood tests as part of routine cancer screening. Talk to your health care provider about prostate cancer screening.  Testicular cancer screening is not recommended for adolescents or adult males who have no symptoms. Screening includes self-exam, a health care provider exam, and other screening tests. Consult with your health care provider about any symptoms you have or any concerns you have about testicular cancer.  Practice safe sex.  Use condoms and avoid high-risk sexual practices to reduce the spread of sexually transmitted infections (STIs).  You should be screened for STIs, including gonorrhea and chlamydia if:  You are sexually active and are younger than 24 years.  You are older than 24 years, and your health care provider tells you that you are at risk for this type of infection.  Your sexual activity has changed since you were last screened, and you are at an increased risk for chlamydia or gonorrhea. Ask your health care provider if you are at risk.  If you are at risk of being infected with HIV, it is recommended that you take a prescription medicine daily to prevent HIV infection. This is called pre-exposure prophylaxis (PrEP). You are considered at risk if:  You are a man who has sex with other men (MSM).  You are a heterosexual man who is sexually active with multiple partners.  You take drugs by injection.  You are sexually active with a partner who has HIV.  Talk with your health care provider about whether you are at high risk of being  infected with HIV. If you choose to begin PrEP, you should first be tested for HIV. You should then be tested every 3 months for as long as you are taking PrEP.  Use sunscreen. Apply sunscreen liberally and repeatedly throughout the day. You should seek shade when your shadow is shorter than you. Protect yourself by wearing long sleeves, pants, a wide-brimmed hat, and sunglasses year round whenever you are outdoors.  Tell your health care provider of new moles or changes in moles, especially if there is a change in shape or color. Also, tell your health care provider if a mole is larger than the size of a pencil eraser.  A one-time screening for abdominal aortic aneurysm (AAA) and surgical repair of large AAAs by ultrasound is recommended for men aged 61-75 years who are current or former smokers.  Stay current with your vaccines (immunizations).   This information is not intended to replace advice given to you by your health care provider. Make sure you discuss any questions you have with your health care provider.   Document Released: 05/20/2008 Document Revised: 12/13/2014 Document Reviewed: 04/19/2011 Elsevier Interactive Patient Education [employer].

## 2016-11-18 NOTE — Progress Notes (Signed)
Subjective:    Patient ID: Michael Miranda, male    DOB: [birth year], 75 y.o.   MRN: [id number]  HPI  Patient presents for yearly follow up exam for chronic disease management. He is a pleasant 75 year old male who  has a past medical history of Alcohol abuse; Arthritis; Depression; Hyperlipidemia; Hypertension; and Kidney stones.  All immunizations and health maintenance protocols were reviewed with the patient and needed orders were placed.  Appropriate screening laboratory values were ordered for the patient including screening of hyperlipidemia, renal function and hepatic function. If indicated by BPH, a PSA was ordered.  Medication reconciliation,  past medical history, social history, problem list and allergies were reviewed in detail with the patient  Goals were established with regard to weight loss, exercise, and  diet in compliance with medications. He stays active and eats a heart healthy diet.   End of life planning was discussed. He has brought a copy of his living will and advanced directive.   He is up to date on his colonoscopy.   He has his follow up appointment with Cardiology in January 2018.   Overall, he feels as though he is doing very well. He continues to walk   He reports that his blood pressure at home has been in the 140's.   BP Readings from Last 3 Encounters:  11/18/16 (!) 152/80  07/23/16 130/70  11/25/15 (!) 140/100    Review of Systems  Constitutional: Negative.   HENT: Negative.   Eyes: Negative.   Respiratory: Negative.   Cardiovascular: Negative.   Gastrointestinal: Negative.   Endocrine: Negative.   Genitourinary: Negative.   Musculoskeletal: Negative.   Skin: Negative.   Allergic/Immunologic: Negative.   Neurological: Negative.   Hematological: Negative.   Psychiatric/Behavioral: Negative.   All other systems reviewed and are negative.  Past Medical History:  Diagnosis Date  . Alcohol abuse    10 years ago   . Arthritis   .  Depression   . Hyperlipidemia   . Hypertension   . Kidney stones    20 years ago    Social History   Social History  . Marital status: Married    Spouse name: N/A  . Number of children: N/A  . Years of education: N/A   Occupational History  . Not on file.   Social History Main Topics  . Smoking status: Never Smoker  . Smokeless tobacco: Not on file  . Alcohol use 0.0 oz/week     Comment: Wine with dinner  . Drug use: No  . Sexual activity: Not on file   Other Topics Concern  . 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     Past Surgical History:  Procedure Laterality Date  . COCHLEAR IMPLANT     bilateral  . HERNIA REPAIR      Family History  Problem Relation Age of Onset  . Heart attack Brother   . Coronary artery disease Brother   . Cancer Father     lung  . Dementia Mother     No Known Allergies  Current Outpatient Prescriptions on File Prior to Visit  Medication Sig Dispense Refill  . aspirin EC 81 MG tablet Take 1 tablet (81 mg total) by mouth  daily. 1 tablet 0  . benazepril-hydrochlorthiazide (LOTENSIN HCT) 20-12.5 MG tablet take 1 tablet by mouth once daily 90 tablet 1  . simvastatin (ZOCOR) 10 MG tablet Take 1 tablet (10 mg total) by mouth at bedtime. 90 tablet 3  . tolterodine (DETROL LA) 2 MG 24 hr capsule Take 1 capsule (2 mg total) by mouth daily. 30 capsule 3  . traZODone (DESYREL) 50 MG tablet Take 1 tablet (50 mg total) by mouth at bedtime. 90 tablet 1  . zolpidem (AMBIEN) 10 MG tablet take 1 tablet by mouth at bedtime for sleep 30 tablet 0   No current facility-administered medications on file prior to visit.     There were no vitals taken for this visit.      Objective:   Physical Exam  Constitutional: He is oriented to person, place, and time. He appears  well-developed and well-nourished. No distress.  HENT:  Head: Normocephalic and atraumatic.  Right Ear: External ear normal.  Left Ear: External ear normal.  Nose: Nose normal.  Mouth/Throat: Oropharynx is clear and moist. No oropharyngeal exudate.  cochlear implants   Eyes: Conjunctivae and EOM are normal. Pupils are equal, round, and reactive to light. Right eye exhibits no discharge. Left eye exhibits no discharge. No scleral icterus.  Neck: Trachea normal and normal range of motion. Neck supple. No JVD present. Carotid bruit is not present. No tracheal deviation present. No thyroid mass and no thyromegaly present.  Cardiovascular: Normal rate, regular rhythm, normal heart sounds and intact distal pulses.  Exam reveals no gallop and no friction rub.   No murmur heard. Pulmonary/Chest: Effort normal and breath sounds normal. No stridor. No respiratory distress. He has no wheezes. He has no rales. He exhibits no tenderness.  Abdominal: Soft. Bowel sounds are normal. He exhibits no distension and no mass. There is no tenderness. There is no rebound and no guarding.  Genitourinary: Prostate normal. Rectal exam shows guaiac negative stool.  Genitourinary Comments: Prostate smooth and symmetrical   Musculoskeletal: Normal range of motion. He exhibits no edema, tenderness or deformity.  Lymphadenopathy:    He has no cervical adenopathy.  Neurological: He is alert and oriented to person, place, and time. He has normal reflexes. He displays normal reflexes. No cranial nerve deficit. He exhibits normal muscle tone. Coordination normal.  Skin: Skin is warm and dry. No rash noted. He is not diaphoretic. No erythema. No pallor.  Psychiatric: He has a normal mood and affect. His behavior is normal. Judgment and thought content normal.  Nursing note and vitals reviewed.     Assessment & Plan:  1. Essential hypertension - EKG 12-Lead- NSR - left atrial enlargement, rate 65  - benazepril (LOTENSIN) 40  MG tablet; Take 1 tablet (40 mg total) by mouth daily.  Dispense: 90 tablet; Refill: 1 - He would like to go off HCTZ. I am ok with this but will increase his Lotensin to 40 mg. Monitor BP at home. Return precautions given  - Basic metabolic panel - CBC with Differential/Platelet - Hepatic function panel - Lipid panel - POCT Urinalysis Dipstick (Automated) - PSA - TSH  2. Hypercholesteremia - Has been well controlled in the past. Will continue with Simvastatin  - Basic metabolic panel - CBC with Differential/Platelet - Hepatic function panel - Lipid panel - POCT Urinalysis Dipstick (Automated) - PSA - TSH  3. Insomnia, unspecified type  - zolpidem (AMBIEN) 10 MG tablet; take 1 tablet by mouth at bedtime for sleep  Dispense: 30  tablet; Refill: 0  Dorothyann Peng, NP

## 2016-11-18 NOTE — Progress Notes (Signed)
Pre visit review using our clinic review tool, if applicable. No additional management support is needed unless otherwise documented below in the visit note. 

## 2016-11-19 LAB — CBC WITH DIFFERENTIAL/PLATELET
BASOS PCT: 0.3 % (ref 0.0–3.0)
Basophils Absolute: 0 10*3/uL (ref 0.0–0.1)
EOS ABS: 0.1 10*3/uL (ref 0.0–0.7)
EOS PCT: 2.6 % (ref 0.0–5.0)
HCT: 42.7 % (ref 39.0–52.0)
HEMOGLOBIN: 14.8 g/dL (ref 13.0–17.0)
LYMPHS ABS: 0.8 10*3/uL (ref 0.7–4.0)
Lymphocytes Relative: 15.5 % (ref 12.0–46.0)
MCHC: 34.6 g/dL (ref 30.0–36.0)
MCV: 99.9 fl (ref 78.0–100.0)
MONO ABS: 0.7 10*3/uL (ref 0.1–1.0)
Monocytes Relative: 13.4 % — ABNORMAL HIGH (ref 3.0–12.0)
NEUTROS PCT: 68.2 % (ref 43.0–77.0)
Neutro Abs: 3.5 10*3/uL (ref 1.4–7.7)
Platelets: 214 10*3/uL (ref 150.0–400.0)
RBC: 4.27 Mil/uL (ref 4.22–5.81)
RDW: 12.8 % (ref 11.5–15.5)
WBC: 5.2 10*3/uL (ref 4.0–10.5)

## 2016-11-20 ENCOUNTER — Encounter: Payer: Self-pay | Admitting: Adult Health

## 2016-12-14 ENCOUNTER — Other Ambulatory Visit: Payer: Self-pay | Admitting: Adult Health

## 2016-12-14 NOTE — Telephone Encounter (Signed)
Last refilled 12/03/15 - ok to refill?

## 2016-12-14 NOTE — Telephone Encounter (Signed)
Ok to refill for one year  

## 2016-12-30 ENCOUNTER — Ambulatory Visit (INDEPENDENT_AMBULATORY_CARE_PROVIDER_SITE_OTHER): Payer: Medicare Other | Admitting: Cardiovascular Disease

## 2016-12-30 ENCOUNTER — Encounter: Payer: Self-pay | Admitting: Cardiovascular Disease

## 2016-12-30 VITALS — BP 150/78 | HR 68 | Ht 68.0 in | Wt 141.1 lb

## 2016-12-30 DIAGNOSIS — I10 Essential (primary) hypertension: Secondary | ICD-10-CM

## 2016-12-30 DIAGNOSIS — E7800 Pure hypercholesterolemia, unspecified: Secondary | ICD-10-CM

## 2016-12-30 DIAGNOSIS — R079 Chest pain, unspecified: Secondary | ICD-10-CM | POA: Diagnosis not present

## 2016-12-30 MED ORDER — AMLODIPINE BESYLATE 5 MG PO TABS: 5.0000 mg | ORAL_TABLET | Freq: Every day | ORAL | 3 refills | Status: DC

## 2016-12-30 NOTE — Progress Notes (Signed)
Cardiology Office Note Date:  12/30/2016   ID:  Michael Miranda, DOB [birth year], MRN [id number]  PCP:  Dorothyann Peng, NP  Cardiologist:  Sherren Mocha, MD    Chief Complaint  Patient presents with  . Follow-up     History of Present Illness: Michael Miranda is a 76 y.o. male who presents for  follow-up evaluation. He was initially seen for evaluation of chest pain in 2014. At that time he underwent a stress Myoview scan that showed thinning of the inferior wall with normal LV function and no ischemia. The study was low-risk and medical therapy was recommended. He was last seen in May 2016.  The patient is here alone today. He reports no significant change in symptoms since his last visit. He has done some reading and would like to discontinue his ACE inhibitor. He prefers to be on a calcium channel blocker. He doesn't have any secondary issue such as chronic kidney disease or diabetes that necessitates an ACE inhibitor.  The patient continues to have chest discomfort with walking. This is long-standing. He also experiences this with emotional stress. He describes a pressure in his chest. He can continue to walk and it generally goes away. There has been no resting chest pain and no progression of symptoms. He denies shortness of breath, edema, or heart palpitations. He also has concerns about being on a statin drug as he is worried about dementia.  Past Medical History:  Diagnosis Date  . Alcohol abuse    10 years ago   . Arthritis   . Depression   . Hyperlipidemia   . Hypertension   . Kidney stones    20 years ago    Past Surgical History:  Procedure Laterality Date  . COCHLEAR IMPLANT     bilateral  . HERNIA REPAIR      Current Outpatient Prescriptions  Medication Sig Dispense Refill  . aspirin EC 81 MG tablet Take 1 tablet (81 mg total) by mouth daily. 1 tablet 0  . simvastatin (ZOCOR) 10 MG tablet Take 1 tablet (10 mg total) by mouth at bedtime. 90 tablet 3  .  tolterodine (DETROL LA) 2 MG 24 hr capsule take 1 capsule by mouth once daily 90 capsule 3  . zolpidem (AMBIEN) 10 MG tablet take 1 tablet by mouth at bedtime for sleep 30 tablet 0  . amLODipine (NORVASC) 5 MG tablet Take 1 tablet (5 mg total) by mouth daily. 90 tablet 3   No current facility-administered medications for this visit.     Allergies:   Patient has no known allergies.   Social History:  The patient  reports that he has never smoked. He has never used smokeless tobacco. He reports that he drinks alcohol. He reports that he does not use drugs.   Family History:  The patient's  family history includes Cancer in his father; Coronary artery disease in his brother; Dementia in his mother; Heart attack in his brother.    ROS:  Please see the history of present illness.  Otherwise, review of systems is positive for Hearing loss.  All other systems are reviewed and negative.    PHYSICAL EXAM: VS:  BP (!) 150/78   Pulse 68   Ht 5\' 8"  (1.727 m)   Wt 141 lb 1.9 oz (64 kg)   BMI 21.46 kg/m  , BMI Body mass index is 21.46 kg/m. GEN: Well nourished, well developed, in no acute distress  HEENT: normal  Neck: no JVD, no  masses. No carotid bruits Cardiac: RRR without murmur or gallop                Respiratory:  clear to auscultation bilaterally, normal work of breathing GI: soft, nontender, nondistended, + BS MS: no deformity or atrophy  Ext: no pretibial edema, pedal pulses 2+= bilaterally Skin: warm and dry, no rash Neuro:  Strength and sensation are intact Psych: euthymic mood, full affect  EKG:  EKG is not ordered today.  Recent Labs: 11/18/2016: ALT 16; BUN 14; Creatinine, Ser 1.00; Hemoglobin 14.8; Platelets 214.0; Potassium 3.8; Sodium 136; TSH 0.91   Lipid Panel     Component Value Date/Time   CHOL 204 (H) 11/18/2016 0934   TRIG 69.0 11/18/2016 0934   HDL 90.00 11/18/2016 0934   CHOLHDL 2 11/18/2016 0934   VLDL 13.8 11/18/2016 0934   LDLCALC 100 (H) 11/18/2016  0934   LDLDIRECT 92.3 10/01/2013 0916      Wt Readings from Last 3 Encounters:  12/30/16 141 lb 1.9 oz (64 kg)  11/18/16 141 lb 9.6 oz (64.2 kg)  07/23/16 133 lb (60.3 kg)     Cardiac Studies Reviewed: Myoview 01-30-2013: Impression Exercise Capacity:  Fair exercise capacity. BP Response:  Normal blood pressure response. Clinical Symptoms:  "Slight chest discomfort" ECG Impression:  No significant ST segment change suggestive of ischemia. Comparison with Prior Nuclear Study: No images to compare  Overall Impression:  Normal stress nuclear study with a small, mild intensity, fixed inferior defect consistent with thinning; no ischemia.  LV Ejection Fraction: 70%.  LV Wall Motion:  NL LV Function; NL Wall Motion  Kirk Ruths  ASSESSMENT AND PLAN: 1.  Chest pain: No change in symptoms but fairly typical symptoms of angina. He does not feel particularly limited. The patient is on aspirin and a statin drug. I have recommended a repeat exercise stress Myoview scan. It has been 4 years since his last study which is reviewed as above. He would like to defer until the spring and will call back to schedule.  2. HTN: Blood pressure is elevated on today's reading. States most readings at home are in the range of 135 or 70. Will change him to amlodipine 5 mg daily. He will call back with some home readings and we can titrate the dose if needed  3. Hyperlipidemia: Reviewed his previous lipids. His LDL has been as high as 185. I recommend that he continue on a statin drug. I reviewed the literature with him regarding dementia and there is no clear length. He understands that some patients do experience memory loss but he has not had any significant issues. He will continue on simvastatin.  Current medicines are reviewed with the patient today.  The patient does not have concerns regarding medicines.  Labs/ tests ordered today include:   Orders Placed This Encounter  Procedures  .  Myocardial Perfusion Imaging    Disposition:   FU one year  Signed, Sherren Mocha, MD  12/30/2016 10:15 AM    [facility] [address], [place], [place]  [address] Phone: [phone]; Fax: [phone]

## 2016-12-30 NOTE — Patient Instructions (Signed)
Medication Instructions:  Your physician has recommended you make the following change in your medication:  1. STOP Benazepril 2. START Amlodipine 5mg  take one tablet by mouth daily  Labwork: No new orders.   Testing/Procedures: Your physician has requested that you have an exercise stress myoview. For further information please visit [email or web address]. Please follow instruction sheet, as given. Please call the office at [phone] when you are ready to schedule this test.   Follow-Up: Your physician wants you to follow-up in: 1 YEAR with Dr Burt Knack.  You will receive a reminder letter in the mail two months in advance. If you don't receive a letter, please call our office to schedule the follow-up appointment.   Any Other Special Instructions Will Be Listed Below (If Applicable).     If you need a refill on your cardiac medications before your next appointment, please call your pharmacy.

## 2017-01-20 ENCOUNTER — Other Ambulatory Visit: Payer: Self-pay | Admitting: Adult Health

## 2017-01-20 DIAGNOSIS — G4700 Insomnia, unspecified: Secondary | ICD-10-CM

## 2017-01-20 NOTE — Telephone Encounter (Signed)
Rx has been called in as directed.  

## 2017-01-20 NOTE — Telephone Encounter (Signed)
Ok to refill 

## 2017-01-23 ENCOUNTER — Encounter: Payer: Self-pay | Admitting: Cardiovascular Disease

## 2017-01-28 ENCOUNTER — Other Ambulatory Visit: Payer: Self-pay

## 2017-02-10 ENCOUNTER — Telehealth (HOSPITAL_COMMUNITY): Payer: Self-pay | Admitting: *Deleted

## 2017-02-10 NOTE — Telephone Encounter (Signed)
Left message on voicemail in reference to upcoming appointment scheduled for 02/10/17. Phone number given for a call back so details instructions can be given.  Michael Miranda

## 2017-02-14 ENCOUNTER — Ambulatory Visit (HOSPITAL_COMMUNITY): Payer: Medicare Other | Attending: Cardiology

## 2017-02-14 DIAGNOSIS — R9439 Abnormal result of other cardiovascular function study: Secondary | ICD-10-CM | POA: Insufficient documentation

## 2017-02-14 DIAGNOSIS — R079 Chest pain, unspecified: Secondary | ICD-10-CM | POA: Diagnosis not present

## 2017-02-14 LAB — MYOCARDIAL PERFUSION IMAGING
CHL CUP NUCLEAR SRS: 2
CHL CUP NUCLEAR SSS: 4
CHL CUP RESTING HR STRESS: 65 {beats}/min
CSEPED: 7 min
CSEPEDS: 4 s
CSEPEW: 8.6 METS
CSEPHR: 89 %
LV dias vol: 90 mL (ref 62–150)
LV sys vol: 32 mL
MPHR: 145 {beats}/min
NUC STRESS TID: 0.95
Peak HR: 130 {beats}/min
RATE: 0.25
SDS: 2

## 2017-02-14 MED ORDER — TECHNETIUM TC 99M TETROFOSMIN IV KIT
10.1000 | PACK | Freq: Once | INTRAVENOUS | Status: AC | PRN
  Administered 2017-02-14: 10.1 via INTRAVENOUS
  Filled 2017-02-14: qty 11

## 2017-02-14 MED ORDER — TECHNETIUM TC 99M TETROFOSMIN IV KIT
32.6000 | PACK | Freq: Once | INTRAVENOUS | Status: AC | PRN
  Administered 2017-02-14: 32.6 via INTRAVENOUS
  Filled 2017-02-14: qty 33

## 2017-03-21 ENCOUNTER — Other Ambulatory Visit: Payer: Self-pay | Admitting: Adult Health

## 2017-03-21 DIAGNOSIS — G4700 Insomnia, unspecified: Secondary | ICD-10-CM

## 2017-03-22 NOTE — Telephone Encounter (Signed)
Ok to refill for one month  

## 2017-04-05 DIAGNOSIS — H35372 Puckering of macula, left eye: Secondary | ICD-10-CM | POA: Diagnosis not present

## 2017-04-28 ENCOUNTER — Other Ambulatory Visit: Payer: Self-pay | Admitting: Adult Health

## 2017-04-28 NOTE — Telephone Encounter (Signed)
Ok to refill for 365 days  

## 2017-05-19 ENCOUNTER — Other Ambulatory Visit: Payer: Self-pay | Admitting: Adult Health

## 2017-05-19 DIAGNOSIS — G4700 Insomnia, unspecified: Secondary | ICD-10-CM

## 2017-05-19 NOTE — Telephone Encounter (Signed)
Ok to refill 

## 2017-05-19 NOTE — Telephone Encounter (Signed)
Rx has been called in as directed.  

## 2017-06-23 ENCOUNTER — Other Ambulatory Visit: Payer: Self-pay | Admitting: Cardiovascular Disease

## 2017-06-23 ENCOUNTER — Other Ambulatory Visit: Payer: Self-pay | Admitting: Adult Health

## 2017-06-23 DIAGNOSIS — I10 Essential (primary) hypertension: Secondary | ICD-10-CM

## 2017-06-23 NOTE — Telephone Encounter (Signed)
Filled by Cardiology - Dr. Burt Knack

## 2017-06-24 ENCOUNTER — Other Ambulatory Visit: Payer: Self-pay | Admitting: Adult Health

## 2017-06-24 DIAGNOSIS — I10 Essential (primary) hypertension: Secondary | ICD-10-CM

## 2017-06-24 NOTE — Telephone Encounter (Signed)
This medication has been discontinued.  Message sent to the pharmacy to remove. Replaced with amlodipine.

## 2017-07-04 ENCOUNTER — Telehealth: Payer: Self-pay | Admitting: Adult Health

## 2017-07-04 ENCOUNTER — Ambulatory Visit (INDEPENDENT_AMBULATORY_CARE_PROVIDER_SITE_OTHER): Payer: Medicare Other | Admitting: Internal Medicine

## 2017-07-04 ENCOUNTER — Encounter: Payer: Self-pay | Admitting: Internal Medicine

## 2017-07-04 VITALS — BP 140/78 | HR 64 | Temp 98.3°F | Wt 135.6 lb

## 2017-07-04 DIAGNOSIS — L989 Disorder of the skin and subcutaneous tissue, unspecified: Secondary | ICD-10-CM

## 2017-07-04 MED ORDER — MUPIROCIN CALCIUM 2 % EX CREA: 1.0000 "application " | TOPICAL_CREAM | Freq: Three times a day (TID) | CUTANEOUS | 0 refills | Status: DC

## 2017-07-04 NOTE — Telephone Encounter (Signed)
Noted  

## 2017-07-04 NOTE — Telephone Encounter (Signed)
Patient Name: MONIQUE GIFT DOB: [birth year] Initial Comment husband has hole in temple area, quarter size, site looks inflamed and brown -2" in size, noticed it last night, cleaned and bandaged site, been there for around a month Nurse Assessment Nurse: Vallery Sa, RN, [place] Date/Time (Eastern Time): 07/04/2017 7:17:14 AM Confirm and document reason for call. If symptomatic, describe symptoms. ---Juanda Crumble states he developed a boil on his left temple about a month ago that became red, inflamed about two days ago. No fever. No active bleeding or drainage. There is a hole present with a black area. Does the patient have any new or worsening symptoms? ---Yes Will a triage be completed? ---Yes Related visit to physician within the last 2 weeks? ---No Does the PT have any chronic conditions? (i.e. diabetes, asthma, etc.) ---Yes List chronic conditions. ---High Blood Pressure, prostate problems Is this a behavioral health or substance abuse call? ---No Guidelines Guideline Title Affirmed Question Affirmed Notes Sores [1] Small red streak or spreading redness (<2 inches; 5 cm) AND [2] no fever Final Disposition User See Physician within [place], RN, T[place] Comments Scheduled for 9:45am appointment with Dr. Lottie Dawson today. Referrals REFERRED TO PCP OFFICE Disagree/Comply: Comply

## 2017-07-04 NOTE — Patient Instructions (Addendum)
concerned that this lesion could be a skin cancer. There is some inflammation and I would have you use topical antibiotic cream I sent to  your pharmacy We will do dermatology appointment as referral to Dr. Armanda Magic office.

## 2017-07-04 NOTE — Progress Notes (Signed)
Chief Complaint  Patient presents with  . Acute Visit    HPI: Michael Miranda 76 y.o.   Here with wife sda  Sent in by Th  pcp not in office until tomorrow.. problem with skin lesion  For a month and now imflammed  For a few days    Wife cleaned with dial soap and looks some better today  No fever  Systemic sx related . No trauma  Hx of pre skin cancer in past on face but no opther dx  Used to see dr  Tonia Brooms and now dr Renda Rolls for rouitine  ? When  This lesion has been there a month  And now  inflammed .  No dc   ROS: See pertinent positives and negatives per HPI.  Past Medical History:  Diagnosis Date  . Alcohol abuse    10 years ago   . Arthritis   . Depression   . Hyperlipidemia   . Hypertension   . Kidney stones    20 years ago    Family History  Problem Relation Age of Onset  . Heart attack Brother   . Coronary artery disease Brother   . Cancer Father        lung  . Dementia Mother     Social History   Social History  . Marital status: Married    Spouse name: N/A  . Number of children: N/A  . Years of education: N/A   Social History Main Topics  . Smoking status: Never Smoker  . Smokeless tobacco: Never Used  . Alcohol use 0.0 oz/week     Comment: Wine with dinner  . Drug use: No  . Sexual activity: Not Asked   Other Topics Concern  . 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     Outpatient Medications Prior to Visit  Medication Sig Dispense Refill  . aspirin EC 81 MG tablet Take 1 tablet (81 mg total) by mouth daily. 1 tablet 0  . simvastatin (ZOCOR) 10 MG tablet take 1 tablet by mouth at bedtime 90 tablet 3  . tolterodine (DETROL LA) 2 MG 24 hr capsule take 1 capsule by mouth once daily 90 capsule 3  . zolpidem (AMBIEN) 10 MG tablet take 1 tablet by mouth once daily at bedtime if  needed for sleep 30 tablet 0  . amLODipine (NORVASC) 5 MG tablet Take 1 tablet (5 mg total) by mouth daily. 90 tablet 3   No facility-administered medications prior to visit.      EXAM:  BP 140/78 (BP Location: Right Arm, Patient Position: Sitting, Cuff Size: Normal)   Pulse 64   Temp 98.3 F (36.8 C) (Oral)   Wt 135 lb 9.6 oz (61.5 kg)   BMI 20.62 kg/m   Body mass index is 20.62 kg/m.  GENERAL: vitals reviewed and listed above, alert, oriented, appears well hydrated and in no acute distress HEENT: atraumatic, conjunctiva  clear, no obvious abnormalities on inspection of external nose and ears    Face left tmple area 1 c, heaped up lesion with central crater    No dc and no tenderness of import and nodc or FB seen  MS: moves all extremities without noticeable focal  abnormality PSYCH: pleasant and cooperative, no obvious  depression or anxiety  ASSESSMENT AND PLAN:  Discussed the following assessment and plan:  Skin lesion of face - present for a month and now inflammatory concer about  skin cancer  as cause disc with pt and wife at length local care adn referral  - Plan: Ambulatory referral to Dermatology, mupirocin cream (BACTROBAN) 2 % ?s answered as best as possibles   Options of care discussed   Do not think in any immediate danger .  But will try to facilitate  Derm appt.    Expectant management.  -Patient advised to return or notify health care team  if symptoms worsen ,persist or new concerns arise.  Patient Instructions   concerned that this lesion could be a skin cancer. There is some inflammation and I would have you use topical antibiotic cream I sent to  your pharmacy We will do dermatology appointment as referral to Dr. Armanda Magic office.    Michael Brooking. Burley [place] M.D.

## 2017-07-05 DIAGNOSIS — D485 Neoplasm of uncertain behavior of skin: Secondary | ICD-10-CM | POA: Diagnosis not present

## 2017-07-05 DIAGNOSIS — C44329 Squamous cell carcinoma of skin of other parts of face: Secondary | ICD-10-CM | POA: Diagnosis not present

## 2017-07-10 ENCOUNTER — Other Ambulatory Visit: Payer: Self-pay | Admitting: Adult Health

## 2017-07-10 DIAGNOSIS — G4700 Insomnia, unspecified: Secondary | ICD-10-CM

## 2017-07-11 DIAGNOSIS — C44329 Squamous cell carcinoma of skin of other parts of face: Secondary | ICD-10-CM | POA: Diagnosis not present

## 2017-07-12 NOTE — Telephone Encounter (Signed)
Ok to refill for 30 days  

## 2017-07-12 NOTE — Telephone Encounter (Signed)
RX done. 

## 2017-07-12 NOTE — Telephone Encounter (Signed)
Last Filled on 05/18/17 #30 Last seen by Tommi Rumps on 11/18/17 No future appointment scheduled.

## 2017-07-20 ENCOUNTER — Other Ambulatory Visit: Payer: Self-pay | Admitting: Adult Health

## 2017-08-10 DIAGNOSIS — L309 Dermatitis, unspecified: Secondary | ICD-10-CM | POA: Diagnosis not present

## 2017-08-26 ENCOUNTER — Encounter: Payer: Self-pay | Admitting: Adult Health

## 2017-09-02 ENCOUNTER — Other Ambulatory Visit: Payer: Self-pay | Admitting: Adult Health

## 2017-09-02 DIAGNOSIS — G4700 Insomnia, unspecified: Secondary | ICD-10-CM

## 2017-09-02 NOTE — Telephone Encounter (Signed)
Michael Miranda called into the pharmacy.

## 2017-11-01 ENCOUNTER — Other Ambulatory Visit: Payer: Self-pay | Admitting: Adult Health

## 2017-11-01 DIAGNOSIS — G4700 Insomnia, unspecified: Secondary | ICD-10-CM

## 2017-11-01 NOTE — Telephone Encounter (Signed)
Ok to refill for 30 days  

## 2017-11-03 ENCOUNTER — Other Ambulatory Visit: Payer: Self-pay | Admitting: Adult Health

## 2017-11-03 DIAGNOSIS — G4700 Insomnia, unspecified: Secondary | ICD-10-CM

## 2017-11-03 NOTE — Telephone Encounter (Signed)
DENIED.  DUPLICATE REQUEST.  CALLED IN EARLIER TODAY.  MESSAGE SENT FOR THE PHARMACY TO CHECK THEIR MACHINE.

## 2017-11-03 NOTE — Telephone Encounter (Signed)
Called to the pharmacy and left on machine. 

## 2017-11-22 ENCOUNTER — Ambulatory Visit (INDEPENDENT_AMBULATORY_CARE_PROVIDER_SITE_OTHER): Payer: Medicare Other | Admitting: Adult Health

## 2017-11-22 ENCOUNTER — Encounter: Payer: Self-pay | Admitting: Adult Health

## 2017-11-22 VITALS — BP 136/84 | Temp 98.2°F | Ht 68.0 in | Wt 137.0 lb

## 2017-11-22 DIAGNOSIS — G4700 Insomnia, unspecified: Secondary | ICD-10-CM

## 2017-11-22 DIAGNOSIS — N3281 Overactive bladder: Secondary | ICD-10-CM

## 2017-11-22 DIAGNOSIS — I10 Essential (primary) hypertension: Secondary | ICD-10-CM

## 2017-11-22 DIAGNOSIS — E7800 Pure hypercholesterolemia, unspecified: Secondary | ICD-10-CM

## 2017-11-22 LAB — POCT URINALYSIS DIPSTICK
BILIRUBIN UA: NEGATIVE
Blood, UA: NEGATIVE
GLUCOSE UA: NEGATIVE
Ketones, UA: NEGATIVE
LEUKOCYTES UA: NEGATIVE
Nitrite, UA: NEGATIVE
Odor: NEGATIVE
PH UA: 7 (ref 5.0–8.0)
Protein, UA: NEGATIVE
Spec Grav, UA: 1.01 (ref 1.010–1.025)
UROBILINOGEN UA: 0.2 U/dL

## 2017-11-22 LAB — HEPATIC FUNCTION PANEL
ALBUMIN: 4.5 g/dL (ref 3.5–5.2)
ALK PHOS: 91 U/L (ref 39–117)
ALT: 17 U/L (ref 0–53)
AST: 25 U/L (ref 0–37)
BILIRUBIN DIRECT: 0.1 mg/dL (ref 0.0–0.3)
TOTAL PROTEIN: 7 g/dL (ref 6.0–8.3)
Total Bilirubin: 0.7 mg/dL (ref 0.2–1.2)

## 2017-11-22 LAB — LIPID PANEL
CHOL/HDL RATIO: 2
CHOLESTEROL: 180 mg/dL (ref 0–200)
HDL: 90.2 mg/dL (ref >39.00)
LDL CALC: 73 mg/dL (ref 0–99)
NonHDL: 89.78
TRIGLYCERIDES: 83 mg/dL (ref 0.0–149.0)
VLDL: 16.6 mg/dL (ref 0.0–40.0)

## 2017-11-22 LAB — CBC WITH DIFFERENTIAL/PLATELET
BASOS ABS: 0 10*3/uL (ref 0.0–0.1)
Basophils Relative: 0.9 % (ref 0.0–3.0)
EOS PCT: 3 % (ref 0.0–5.0)
Eosinophils Absolute: 0.1 10*3/uL (ref 0.0–0.7)
HEMATOCRIT: 45.9 % (ref 39.0–52.0)
HEMOGLOBIN: 15.7 g/dL (ref 13.0–17.0)
LYMPHS ABS: 0.7 10*3/uL (ref 0.7–4.0)
LYMPHS PCT: 17.6 % (ref 12.0–46.0)
MCHC: 34.2 g/dL (ref 30.0–36.0)
MCV: 101.4 fl — AB (ref 78.0–100.0)
MONOS PCT: 16.1 % — AB (ref 3.0–12.0)
Monocytes Absolute: 0.7 10*3/uL (ref 0.1–1.0)
NEUTROS ABS: 2.6 10*3/uL (ref 1.4–7.7)
Neutrophils Relative %: 62.4 % (ref 43.0–77.0)
Platelets: 193 10*3/uL (ref 150.0–400.0)
RBC: 4.52 Mil/uL (ref 4.22–5.81)
RDW: 13.3 % (ref 11.5–15.5)
WBC: 4.2 10*3/uL (ref 4.0–10.5)

## 2017-11-22 LAB — BASIC METABOLIC PANEL
BUN: 15 mg/dL (ref 6–23)
CALCIUM: 9.2 mg/dL (ref 8.4–10.5)
CO2: 29 mEq/L (ref 19–32)
CREATININE: 0.98 mg/dL (ref 0.40–1.50)
Chloride: 101 mEq/L (ref 96–112)
GFR: 79.01 mL/min (ref >60.00)
Glucose, Bld: 82 mg/dL (ref 70–99)
Potassium: 4 mEq/L (ref 3.5–5.1)
Sodium: 139 mEq/L (ref 135–145)

## 2017-11-22 LAB — TSH: TSH: 0.85 u[IU]/mL (ref 0.35–4.50)

## 2017-11-22 NOTE — Progress Notes (Signed)
Subjective:    Patient ID: Michael Miranda, male    DOB: 1941-06-24, 76 y.o.   MRN: [id number]  HPI  Patient presents for yearly follow up exam He is a pleasant 76 year old male who  has a past medical history of Alcohol abuse, Arthritis, Depression, Hyperlipidemia, Hypertension, and Kidney stones.  He takes Norvasc 5 mg for hypertension. He reports readings at home in the 135/70 range. He will see Cardiology next month. He denies any chest pain or SOB at this time.   He takes Ambien 5 mg for insomnia   He takes Zocor 10 mg for hyperlipidemia   He is taking Detrol LA 2 mg for overactive bladder. He is worried about the anticholinergic effects of this medication and would like to switch to something else.  He does report dry mouth but has not experienced any dizziness or loss of coordination  All immunizations and health maintenance protocols were reviewed with the patient and needed orders were placed. He denies any   Appropriate screening laboratory values were ordered for the patient including screening of hyperlipidemia, renal function and hepatic function. If indicated by BPH, a PSA was ordered.  Medication reconciliation,  past medical history, social history, problem list and allergies were reviewed in detail with the patient  Goals were established with regard to weight loss, exercise, and  diet in compliance with medications. He stays active and eats healthy   End of life planning was discussed.  He no longer needs a colonoscopy. He does routine dental and vision exams.   His only interval history was having BCC removed from the right side of his head  Reports " overall, I am doing really well and I feel great!" He has no acute issues he would like to discuss today     Review of Systems  Constitutional: Negative.   HENT: Negative.   Eyes: Negative.   Respiratory: Negative.   Cardiovascular: Negative.   Gastrointestinal: Negative.   Endocrine: Negative.     Genitourinary: Negative.   Musculoskeletal: Negative.   Skin: Negative.   Allergic/Immunologic: Negative.   Neurological: Negative.   Hematological: Negative.   Psychiatric/Behavioral: Negative.   All other systems reviewed and are negative.     Past Medical History:  Diagnosis Date  . Alcohol abuse    10 years ago   . Arthritis   . Depression   . Hyperlipidemia   . Hypertension   . Kidney stones    20 years ago    Social History   Socioeconomic History  . Marital status: Married    Spouse name: Not on file  . Number of children: Not on file  . Years of education: Not on file  . Highest education level: Not on file  Social Needs  . Financial resource strain: Not on file  . Food insecurity - worry: Not on file  . Food insecurity - inability: Not on file  . Transportation needs - medical: Not on file  . Transportation needs - non-medical: Not on file  Occupational History  . Not on file  Tobacco Use  . Smoking status: Never Smoker  . Smokeless tobacco: Never Used  Substance and Sexual Activity  . Alcohol use: Yes    Alcohol/week: 0.0 oz    Comment: Wine with dinner  . Drug use: No  . Sexual activity: Not on file  Other Topics Concern  . 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     Past Surgical History:  Procedure Laterality Date  . COCHLEAR IMPLANT     bilateral  . HERNIA REPAIR      Family History  Problem Relation Age of Onset  . Heart attack Brother   . Coronary artery disease Brother   . Cancer Father        lung  . Dementia Mother     No Known Allergies  Current Outpatient Medications on File Prior to Visit  Medication Sig Dispense Refill  . Coenzyme Q10 (CO Q-10 PO) Take by mouth.    . simvastatin (ZOCOR) 10 MG tablet take 1 tablet by mouth at bedtime 90 tablet 3  .  tolterodine (DETROL LA) 2 MG 24 hr capsule take 1 capsule by mouth once daily 90 capsule 3  . zolpidem (AMBIEN) 10 MG tablet take 1 tablet by mouth at bedtime if needed for sleep 30 tablet 0  . amLODipine (NORVASC) 5 MG tablet Take 1 tablet (5 mg total) by mouth daily. 90 tablet 3  . aspirin EC 81 MG tablet Take 1 tablet (81 mg total) by mouth daily. 1 tablet 0   No current facility-administered medications on file prior to visit.     BP 136/84 (BP Location: Left Arm)   Temp 98.2 F (36.8 C) (Oral)   Ht 5\' 8"  (1.727 m)   Wt 137 lb (62.1 kg)   BMI 20.83 kg/m       Objective:   Physical Exam  Constitutional: He is oriented to person, place, and time. He appears well-developed and well-nourished. No distress.  HENT:  Head: Normocephalic and atraumatic.  Right Ear: External ear normal.  Left Ear: External ear normal.  Nose: Nose normal.  Mouth/Throat: Oropharynx is clear and moist. No oropharyngeal exudate.  Eyes: Conjunctivae and EOM are normal. Pupils are equal, round, and reactive to light. Right eye exhibits no discharge. Left eye exhibits no discharge. No scleral icterus.  Cochlear implants     Neck: Trachea normal and normal range of motion. Neck supple. No JVD present. Carotid bruit is not present. No tracheal deviation present. No thyroid mass and no thyromegaly present.  Cardiovascular: Normal rate, regular rhythm, normal heart sounds and intact distal pulses. Exam reveals no gallop and no friction rub.  No murmur heard. Pulmonary/Chest: Effort normal and breath sounds normal. No stridor. No respiratory distress. He has no wheezes. He has no rales. He exhibits no tenderness.  Abdominal: Soft. Bowel sounds are normal. He exhibits no distension and no mass. There is no tenderness. There is no rebound and no guarding.  Musculoskeletal: Normal range of motion. He exhibits no edema, tenderness or deformity.  Lymphadenopathy:    He has no cervical adenopathy.  Neurological: He is  alert and oriented to person, place, and time. He has normal reflexes. He displays normal reflexes. No cranial nerve deficit. He exhibits normal muscle tone. Coordination normal.  Skin: Skin is warm and dry. No rash noted. He is not diaphoretic. No erythema. No pallor.  Psychiatric: He has a normal mood and affect. His behavior is normal. Judgment and thought content normal.  Nursing note and vitals reviewed.     Assessment & Plan:  1. Essential hypertension - Near goal. He will see cardiology in one month  - CBC with Differential/Platelet -  Hepatic function panel - Basic metabolic panel - Lipid panel - TSH - POC Urinalysis Dipstick  2. Hypercholesteremia - Consider increasing Zocor  - CBC with Differential/Platelet - Hepatic function panel - Basic metabolic panel - Lipid panel - TSH  3. Insomnia, unspecified type - Continue with Ambien  - CBC with Differential/Platelet - Hepatic function panel - Basic metabolic panel - Lipid panel - TSH  4. Overactive bladder - Sample of Myrbetriq given  - He can stop Detrol LA for the time being.  - Advised to monitor BP as in a small population of patients this medication can cause elevated BP readings.   Dorothyann Peng, NP

## 2017-11-22 NOTE — Patient Instructions (Signed)
It was great seeing you today!   I will follow up with you regarding your labs   Stop the Detrol and try Myrbetriq. If you feel as though this medication works, please let me know and I will send some in.   Health Maintenance, Male A healthy lifestyle and preventative care can promote health and wellness.  Maintain regular health, dental, and eye exams.  Eat a healthy diet. Foods like vegetables, fruits, whole grains, low-fat dairy products, and lean protein foods contain the nutrients you need and are low in calories. Decrease your intake of foods high in solid fats, added sugars, and salt. Get information about a proper diet from your health care provider, if necessary.  Regular physical exercise is one of the most important things you can do for your health. Most adults should get at least 150 minutes of moderate-intensity exercise (any activity that increases your heart rate and causes you to sweat) each week. In addition, most adults need muscle-strengthening exercises on 2 or more days a week.   Maintain a healthy weight. The body mass index (BMI) is a screening tool to identify possible weight problems. It provides an estimate of body fat based on height and weight. Your health care provider can find your BMI and can help you achieve or maintain a healthy weight. For males 20 years and older:  A BMI below 18.5 is considered underweight.  A BMI of 18.5 to 24.9 is normal.  A BMI of 25 to 29.9 is considered overweight.  A BMI of 30 and above is considered obese.  Maintain normal blood lipids and cholesterol by exercising and minimizing your intake of saturated fat. Eat a balanced diet with plenty of fruits and vegetables. Blood tests for lipids and cholesterol should begin at age 65 and be repeated every 5 years. If your lipid or cholesterol levels are high, you are over age 50, or you are at high risk for heart disease, you may need your cholesterol levels checked more  frequently.Ongoing high lipid and cholesterol levels should be treated with medicines if diet and exercise are not working.  If you smoke, find out from your health care provider how to quit. If you do not use tobacco, do not start.  Lung cancer screening is recommended for adults aged 72-80 years who are at high risk for developing lung cancer because of a history of smoking. A yearly low-dose CT scan of the lungs is recommended for people who have at least a 30-pack-year history of smoking and are current smokers or have quit within the past 15 years. A pack year of smoking is smoking an average of 1 pack of cigarettes a day for 1 year (for example, a 30-pack-year history of smoking could mean smoking 1 pack a day for 30 years or 2 packs a day for 15 years). Yearly screening should continue until the smoker has stopped smoking for at least 15 years. Yearly screening should be stopped for people who develop a health problem that would prevent them from having lung cancer treatment.  If you choose to drink alcohol, do not have more than 2 drinks per day. One drink is considered to be 12 oz (360 mL) of beer, 5 oz (150 mL) of wine, or 1.5 oz (45 mL) of liquor.  Avoid the use of street drugs. Do not share needles with anyone. Ask for help if you need support or instructions about stopping the use of drugs.  High blood pressure causes heart disease  and increases the risk of stroke. High blood pressure is more likely to develop in:  People who have blood pressure in the end of the normal range (100-139/85-89 mm Hg).  People who are overweight or obese.  People who are African American.  If you are 58-49 years of age, have your blood pressure checked every 3-5 years. If you are 7 years of age or older, have your blood pressure checked every year. You should have your blood pressure measured twice--once when you are at a hospital or clinic, and once when you are not at a hospital or clinic. Record the  average of the two measurements. To check your blood pressure when you are not at a hospital or clinic, you can use:  An automated blood pressure machine at a pharmacy.  A home blood pressure monitor.  If you are 110-98 years old, ask your health care provider if you should take aspirin to prevent heart disease.  Diabetes screening involves taking a blood sample to check your fasting blood sugar level. This should be done once every 3 years after age 24 if you are at a normal weight and without risk factors for diabetes. Testing should be considered at a younger age or be carried out more frequently if you are overweight and have at least 1 risk factor for diabetes.  Colorectal cancer can be detected and often prevented. Most routine colorectal cancer screening begins at the age of 109 and continues through age 92. However, your health care provider may recommend screening at an earlier age if you have risk factors for colon cancer. On a yearly basis, your health care provider may provide home test kits to check for hidden blood in the stool. A small camera at the end of a tube may be used to directly examine the colon (sigmoidoscopy or colonoscopy) to detect the earliest forms of colorectal cancer. Talk to your health care provider about this at age 41 when routine screening begins. A direct exam of the colon should be repeated every 5-10 years through age 31, unless early forms of precancerous polyps or small growths are found.  People who are at an increased risk for hepatitis B should be screened for this virus. You are considered at high risk for hepatitis B if:  You were born in a country where hepatitis B occurs often. Talk with your health care provider about which countries are considered high risk.  Your parents were born in a high-risk country and you have not received a shot to protect against hepatitis B (hepatitis B vaccine).  You have HIV or AIDS.  You use needles to inject street  drugs.  You live with, or have sex with, someone who has hepatitis B.  You are a man who has sex with other men (MSM).  You get hemodialysis treatment.  You take certain medicines for conditions like cancer, organ transplantation, and autoimmune conditions.  Hepatitis C blood testing is recommended for all people born from [birth year] through 1965 and any individual with known risk factors for hepatitis C.  Healthy men should no longer receive prostate-specific antigen (PSA) blood tests as part of routine cancer screening. Talk to your health care provider about prostate cancer screening.  Testicular cancer screening is not recommended for adolescents or adult males who have no symptoms. Screening includes self-exam, a health care provider exam, and other screening tests. Consult with your health care provider about any symptoms you have or any concerns you have about testicular cancer.  Practice safe sex. Use condoms and avoid high-risk sexual practices to reduce the spread of sexually transmitted infections (STIs).  You should be screened for STIs, including gonorrhea and chlamydia if:  You are sexually active and are younger than 24 years.  You are older than 24 years, and your health care provider tells you that you are at risk for this type of infection.  Your sexual activity has changed since you were last screened, and you are at an increased risk for chlamydia or gonorrhea. Ask your health care provider if you are at risk.  If you are at risk of being infected with HIV, it is recommended that you take a prescription medicine daily to prevent HIV infection. This is called pre-exposure prophylaxis (PrEP). You are considered at risk if:  You are a man who has sex with other men (MSM).  You are a heterosexual man who is sexually active with multiple partners.  You take drugs by injection.  You are sexually active with a partner who has HIV.  Talk with your health care provider about  whether you are at high risk of being infected with HIV. If you choose to begin PrEP, you should first be tested for HIV. You should then be tested every 3 months for as long as you are taking PrEP.  Use sunscreen. Apply sunscreen liberally and repeatedly throughout the day. You should seek shade when your shadow is shorter than you. Protect yourself by wearing long sleeves, pants, a wide-brimmed hat, and sunglasses year round whenever you are outdoors.  Tell your health care provider of new moles or changes in moles, especially if there is a change in shape or color. Also, tell your health care provider if a mole is larger than the size of a pencil eraser.  A one-time screening for abdominal aortic aneurysm (AAA) and surgical repair of large AAAs by ultrasound is recommended for men aged 25-75 years who are current or former smokers.  Stay current with your vaccines (immunizations).   This information is not intended to replace advice given to you by your health care provider. Make sure you discuss any questions you have with your health care provider.   Document Released: 05/20/2008 Document Revised: 12/13/2014 Document Reviewed: 04/19/2011 Elsevier Interactive Patient Education [employer].

## 2017-11-30 ENCOUNTER — Telehealth: Payer: Self-pay | Admitting: Adult Health

## 2017-11-30 ENCOUNTER — Other Ambulatory Visit: Payer: Self-pay | Admitting: Adult Health

## 2017-11-30 MED ORDER — MIRABEGRON ER 25 MG PO TB24: 25.0000 mg | ORAL_TABLET | Freq: Every day | ORAL | 3 refills | Status: DC

## 2017-11-30 NOTE — Telephone Encounter (Signed)
Copied from [place]. Topic: Quick Communication - See Telephone Encounter >> Nov 30, 2017  2:53 PM Bea Graff, NT wrote: CRM for notification. See Telephone encounter for: Pt would like a rx for myrbetriq. Was told by [facility] to call when ready for this medication to be filled. [facility] on [address].   11/30/17.

## 2017-12-26 DIAGNOSIS — H903 Sensorineural hearing loss, bilateral: Secondary | ICD-10-CM | POA: Diagnosis not present

## 2017-12-28 ENCOUNTER — Other Ambulatory Visit: Payer: Self-pay | Admitting: Adult Health

## 2017-12-28 ENCOUNTER — Other Ambulatory Visit: Payer: Self-pay

## 2017-12-28 DIAGNOSIS — G4700 Insomnia, unspecified: Secondary | ICD-10-CM

## 2017-12-28 MED ORDER — AMLODIPINE BESYLATE 5 MG PO TABS: 5.0000 mg | ORAL_TABLET | Freq: Every day | ORAL | 0 refills | Status: DC

## 2018-01-05 DIAGNOSIS — H903 Sensorineural hearing loss, bilateral: Secondary | ICD-10-CM | POA: Diagnosis not present

## 2018-01-30 ENCOUNTER — Other Ambulatory Visit: Payer: Self-pay | Admitting: Cardiovascular Disease

## 2018-01-30 MED ORDER — AMLODIPINE BESYLATE 5 MG PO TABS: 5.0000 mg | ORAL_TABLET | Freq: Every day | ORAL | 0 refills | Status: DC

## 2018-02-15 ENCOUNTER — Other Ambulatory Visit: Payer: Self-pay | Admitting: Cardiovascular Disease

## 2018-02-16 DIAGNOSIS — H903 Sensorineural hearing loss, bilateral: Secondary | ICD-10-CM | POA: Diagnosis not present

## 2018-02-22 ENCOUNTER — Other Ambulatory Visit: Payer: Self-pay | Admitting: Adult Health

## 2018-02-22 DIAGNOSIS — G4700 Insomnia, unspecified: Secondary | ICD-10-CM

## 2018-03-09 DIAGNOSIS — H903 Sensorineural hearing loss, bilateral: Secondary | ICD-10-CM | POA: Diagnosis not present

## 2018-03-20 ENCOUNTER — Encounter: Payer: Self-pay | Admitting: Cardiovascular Disease

## 2018-03-20 ENCOUNTER — Ambulatory Visit (INDEPENDENT_AMBULATORY_CARE_PROVIDER_SITE_OTHER): Payer: Medicare Other | Admitting: Cardiovascular Disease

## 2018-03-20 VITALS — BP 158/72 | HR 63 | Ht 68.0 in | Wt 137.0 lb

## 2018-03-20 DIAGNOSIS — I10 Essential (primary) hypertension: Secondary | ICD-10-CM

## 2018-03-20 MED ORDER — AMLODIPINE BESYLATE 10 MG PO TABS: 10.0000 mg | ORAL_TABLET | Freq: Every day | ORAL | 3 refills | Status: DC

## 2018-03-20 NOTE — Patient Instructions (Signed)
Medication Instructions:  1) INCREASE AMLODIPINE to 10 mg daily  Please monitor your blood pressure and send [place] a MyChart message with some readings in about 1 month. Your blood pressure goal will be to keep it under 140/90.  Labwork: None  Testing/Procedures: None  Follow-Up: Your provider wants you to follow-up in: 1 year with Dr. Burt Knack. You will receive a reminder letter in the mail two months in advance. If you don't receive a letter, please call our office to schedule the follow-up appointment.    Any Other Special Instructions Will Be Listed Below (If Applicable).     If you need a refill on your cardiac medications before your next appointment, please call your pharmacy.

## 2018-03-20 NOTE — Progress Notes (Signed)
Cardiology Office Note Date:  03/20/2018   ID:  Michael Miranda, DOB [birth year], MRN [id number]  PCP:  Dorothyann Peng, NP  Cardiologist:  Sherren Mocha, MD    Chief Complaint  Patient presents with  . Hypertension     History of Present Illness: Michael Miranda is a 77 y.o. male who presents for follow-up evaluation.  The patient was initially evaluated in 2014 for chest pain and he underwent a stress Myoview study demonstrating thinning of the inferior wall with normal LV function and no ischemia.  Medical therapy was recommended.  When the patient was seen here last year his antihypertensive medicines were adjusted.  He had requested to be changed to a calcium channel blocker rather than ACE inhibitor.  He was changed to amlodipine 5 mg daily.  Recently he has been under much more stress.  He has a lot of things going on at home right now.  His blood pressure has been running in the 150s.  He otherwise has no specific symptoms.  He denies chest pain, chest pressure, shortness of breath, or leg swelling.  He has had no heart palpitations, lightheadedness, or syncope.  He is continued to exercise on a fairly regular basis with no exertional symptoms.  Past Medical History:  Diagnosis Date  . Alcohol abuse    10 years ago   . Arthritis   . Depression   . Hyperlipidemia   . Hypertension   . Kidney stones    20 years ago    Past Surgical History:  Procedure Laterality Date  . COCHLEAR IMPLANT     bilateral  . HERNIA REPAIR      Current Outpatient Medications  Medication Sig Dispense Refill  . amLODipine (NORVASC) 10 MG tablet Take 1 tablet (10 mg total) by mouth daily. 90 tablet 3  . aspirin EC 81 MG tablet Take 1 tablet (81 mg total) by mouth daily. 1 tablet 0  . Coenzyme Q10 (CO Q-10 PO) Take by mouth.    . simvastatin (ZOCOR) 10 MG tablet take 1 tablet by mouth at bedtime 90 tablet 3  . tolterodine (DETROL LA) 2 MG 24 hr capsule take 1 capsule by mouth once daily 90  capsule 3  . zolpidem (AMBIEN) 10 MG tablet TAKE 1 TABLET BY MOUTH AT BEDTIME IF NEEDED FOR SLEEP 30 tablet 0   No current facility-administered medications for this visit.     Allergies:   Patient has no known allergies.   Social History:  The patient  reports that he has never smoked. He has never used smokeless tobacco. He reports that he drinks alcohol. He reports that he does not use drugs.   Family History:  The patient's  family history includes Cancer in his father; Coronary artery disease in his brother; Dementia in his mother; Heart attack in his brother.    ROS:  Please see the history of present illness.  Otherwise, review of systems is positive for anxiety, cough.  All other systems are reviewed and negative.    PHYSICAL EXAM: VS:  BP (!) 158/72   Pulse 63   Ht 5\' 8"  (1.727 m)   Wt 137 lb (62.1 kg)   SpO2 97%   BMI 20.83 kg/m  , BMI Body mass index is 20.83 kg/m. GEN: Pleasant, thin male, in no acute distress  HEENT: normal  Neck: no JVD, no masses. No carotid bruits Cardiac: RRR without murmur or gallop     Respiratory:  clear to auscultation  bilaterally, normal work of breathing GI: soft, nontender, nondistended, + BS MS: no deformity or atrophy  Ext: no pretibial edema, pedal pulses 2+= bilaterally Skin: warm and dry, no rash Neuro:  Strength and sensation are intact Psych: euthymic mood, full affect  EKG:  EKG is ordered today. The ekg ordered today shows normal sinus rhythm 63 bpm, age-indeterminate septal infarct, otherwise within normal limits.  There is no significant change from previous tracings.  Recent Labs: 11/22/2017: ALT 17; BUN 15; Creatinine, Ser 0.98; Hemoglobin 15.7; Platelets 193.0; Potassium 4.0; Sodium 139; TSH 0.85   Lipid Panel     Component Value Date/Time   CHOL 180 11/22/2017 0937   TRIG 83.0 11/22/2017 0937   HDL 90.20 11/22/2017 0937   CHOLHDL 2 11/22/2017 0937   VLDL 16.6 11/22/2017 0937   LDLCALC 73 11/22/2017 0937    LDLDIRECT 92.3 10/01/2013 0916      Wt Readings from Last 3 Encounters:  03/20/18 137 lb (62.1 kg)  11/22/17 137 lb (62.1 kg)  07/04/17 135 lb 9.6 oz (61.5 kg)     Cardiac Studies Reviewed: Myocardial Perfusion Study 02-14-2017: Stress Findings   ECG Baseline ECG exhibits normal sinus rhythm..  Stress Findings The patient exercised following the Bruce protocol.  The patient reported shortness of breath and fatigue during the stress test.   The test was stopped because the patient complained of fatigue and shortness of breath. Patient requested to stop due to fatigue.   Recovery time: 5 minutes.  Response to Stress There was no ST segment deviation noted during stress.  Stress Measurements   Baseline Vitals  Rest HR 65 bpm    Rest BP 152/79 mmHg    Exercise Time  Exercise duration (min) 7 min    Exercise duration (sec) 4 sec    Peak Stress Vitals  Peak HR 130 bpm    Peak BP 178/79 mmHg    Exercise Data  MPHR 145 bpm    Percent HR 89 %    Estimated workload 8.6 METS       Nuclear Stress Measurements   LV sys vol 32 mL    TID 0.95     LV dias vol 90 mL    LHR 0.25     SSS 4     SRS 2     SDS 2          Nuclear Stress Findings   Isotope administration Rest isotope was administered with an IV injection of 10.1 mCi technetium tetrofosmin. Rest SPECT images were obtained approximately 45 minutes post tracer injection. Stress isotope was administered with an IV injection of 32.6 mCi technetium tetrofosmin at peak exercise Stress SPECT images were obtained approximately 30 minutes post tracer injection.  Nuclear Measurements Study was gated.  Rest Perfusion There is a defect present in the basal inferior location.  Stress Perfusion There is a defect present in the basal inferior location.  Perfusion Summary Defect 1:  There is a small defect of mild severity present in the basal inferior location. The defect is non-reversible and consistent with artifact.   Overall Study Impression Myocardial perfusion is abnormal. This is a low risk study. Overall left ventricular systolic function was normal. Nuclear stress EF: 64%.    ASSESSMENT AND PLAN: 1. Hypertension: Blood pressure control is suboptimal.  Discussed treatment strategies.  He understands stress reduction will be an important component of this.  We discussed exercise and techniques such as meditation.  I advised him to increase amlodipine to  10 mg daily.  He will monitor blood pressure and touch base with [place] next month to let us know how he is doing.  Options would include adding a thiazide diuretic or an ACE inhibitor which he has tolerated well in the past if needed.  2.  Hyperlipidemia: The patient's most recent lipids are reviewed demonstrating a cholesterol of 180, HDL 90, LDL 73.  He continues on simvastatin.  Current medicines are reviewed with the patient today.  The patient does not have concerns regarding medicines.  Labs/ tests ordered today include:   Orders Placed This Encounter  Procedures  . EKG 12-Lead   Disposition:   FU 1 year  Signed, Sherren Mocha, MD  03/20/2018 5:43 PM    [facility] [address], [place], [place]  [address] Phone: [phone]; Fax: [phone]

## 2018-04-02 ENCOUNTER — Encounter: Payer: Self-pay | Admitting: Cardiovascular Disease

## 2018-04-18 ENCOUNTER — Other Ambulatory Visit: Payer: Self-pay | Admitting: Adult Health

## 2018-04-18 DIAGNOSIS — G4700 Insomnia, unspecified: Secondary | ICD-10-CM

## 2018-04-23 ENCOUNTER — Encounter: Payer: Self-pay | Admitting: Cardiovascular Disease

## 2018-05-25 ENCOUNTER — Telehealth: Payer: Self-pay | Admitting: *Deleted

## 2018-05-25 NOTE — Telephone Encounter (Signed)
PA initiated via CoverMyMeds. PA approved 04/25/2018 through 05/25/2019. Key: [id number]

## 2018-07-07 ENCOUNTER — Other Ambulatory Visit: Payer: Self-pay | Admitting: Adult Health

## 2018-07-07 MED ORDER — SIMVASTATIN 10 MG PO TABS: 10.0000 mg | ORAL_TABLET | Freq: Every day | ORAL | 3 refills | Status: DC

## 2018-07-14 DIAGNOSIS — H169 Unspecified keratitis: Secondary | ICD-10-CM | POA: Diagnosis not present

## 2018-07-14 DIAGNOSIS — H01009 Unspecified blepharitis unspecified eye, unspecified eyelid: Secondary | ICD-10-CM | POA: Diagnosis not present

## 2018-07-19 DIAGNOSIS — H15092 Other scleritis, left eye: Secondary | ICD-10-CM | POA: Diagnosis not present

## 2018-07-21 DIAGNOSIS — H169 Unspecified keratitis: Secondary | ICD-10-CM | POA: Diagnosis not present

## 2018-07-21 DIAGNOSIS — H15092 Other scleritis, left eye: Secondary | ICD-10-CM | POA: Diagnosis not present

## 2018-07-21 DIAGNOSIS — H01009 Unspecified blepharitis unspecified eye, unspecified eyelid: Secondary | ICD-10-CM | POA: Diagnosis not present

## 2018-07-27 DIAGNOSIS — H15092 Other scleritis, left eye: Secondary | ICD-10-CM | POA: Diagnosis not present

## 2018-07-27 DIAGNOSIS — H169 Unspecified keratitis: Secondary | ICD-10-CM | POA: Diagnosis not present

## 2018-08-01 DIAGNOSIS — H15092 Other scleritis, left eye: Secondary | ICD-10-CM | POA: Diagnosis not present

## 2018-08-01 DIAGNOSIS — H169 Unspecified keratitis: Secondary | ICD-10-CM | POA: Diagnosis not present

## 2018-08-01 DIAGNOSIS — H10811 Pingueculitis, right eye: Secondary | ICD-10-CM | POA: Diagnosis not present

## 2018-08-11 ENCOUNTER — Telehealth: Payer: Self-pay | Admitting: Family Medicine

## 2018-08-11 NOTE — Telephone Encounter (Signed)
Copied from [place] [id number] [phone]. Topic: Appointment Scheduling - Scheduling Inquiry for Clinic >> Aug 11, 2018  1:08 PM Synthia Innocent wrote: CRM for notification.  [facility] requesting lab work, CBC, ESR Uric Acid, CXR, RPR, Fta-abs, anca, Rheumatoid factor, Ace, ANA, HLA-b27, Lyme anitbody, UA, Quantiferon, Acetylcholine, Repter antibodies?, TSH, 3T4, TSI, is patient able to come in and have this done here?

## 2018-08-11 NOTE — Telephone Encounter (Signed)
He can come here, but I cannot guarantee that insurance will pay for them here.   I am going to need diagnosis?

## 2018-08-11 NOTE — Telephone Encounter (Signed)
Michael Miranda, I spoke to the pt and his wife.  The ophthalmologist is worried that there may be an underlying autoimmune disorder.  Pt has had an infection in his left eye that has been unable to be cured.  He has been on steroids and antibiotic eye drops.  Has been to see the ophthalmologist 4-5 times for this same problem.  Pt requested to come in and speak with you.  Has another upcoming appt with [facility] on 08/18/18.  Also mentioned needing medication refills.

## 2018-08-15 ENCOUNTER — Ambulatory Visit (INDEPENDENT_AMBULATORY_CARE_PROVIDER_SITE_OTHER): Payer: Medicare Other

## 2018-08-15 ENCOUNTER — Ambulatory Visit (INDEPENDENT_AMBULATORY_CARE_PROVIDER_SITE_OTHER): Payer: Medicare Other | Admitting: Adult Health

## 2018-08-15 ENCOUNTER — Encounter: Payer: Self-pay | Admitting: Adult Health

## 2018-08-15 VITALS — BP 130/60 | Temp 98.3°F | Wt 132.0 lb

## 2018-08-15 DIAGNOSIS — R918 Other nonspecific abnormal finding of lung field: Secondary | ICD-10-CM | POA: Diagnosis not present

## 2018-08-15 DIAGNOSIS — H15002 Unspecified scleritis, left eye: Secondary | ICD-10-CM

## 2018-08-15 LAB — POCT URINALYSIS DIPSTICK
Bilirubin, UA: NEGATIVE
Blood, UA: NEGATIVE
Glucose, UA: NEGATIVE
Ketones, UA: NEGATIVE
Leukocytes, UA: NEGATIVE
NITRITE UA: NEGATIVE
PH UA: 6 (ref 5.0–8.0)
PROTEIN UA: NEGATIVE
Spec Grav, UA: 1.015 (ref 1.010–1.025)
UROBILINOGEN UA: 0.2 U/dL

## 2018-08-15 LAB — CBC WITH DIFFERENTIAL/PLATELET
Basophils Absolute: 0 10*3/uL (ref 0.0–0.1)
Basophils Relative: 0.8 % (ref 0.0–3.0)
EOS PCT: 6.5 % — AB (ref 0.0–5.0)
Eosinophils Absolute: 0.3 10*3/uL (ref 0.0–0.7)
HEMATOCRIT: 39.9 % (ref 39.0–52.0)
HEMOGLOBIN: 14.1 g/dL (ref 13.0–17.0)
LYMPHS ABS: 0.7 10*3/uL (ref 0.7–4.0)
Lymphocytes Relative: 15.8 % (ref 12.0–46.0)
MCHC: 35.3 g/dL (ref 30.0–36.0)
MCV: 96.5 fl (ref 78.0–100.0)
MONOS PCT: 13.9 % — AB (ref 3.0–12.0)
Monocytes Absolute: 0.6 10*3/uL (ref 0.1–1.0)
NEUTROS PCT: 63 % (ref 43.0–77.0)
Neutro Abs: 2.8 10*3/uL (ref 1.4–7.7)
Platelets: 277 10*3/uL (ref 150.0–400.0)
RBC: 4.13 Mil/uL — AB (ref 4.22–5.81)
RDW: 12.5 % (ref 11.5–15.5)
WBC: 4.5 10*3/uL (ref 4.0–10.5)

## 2018-08-15 LAB — T3, FREE: T3 FREE: 2.6 pg/mL (ref 2.3–4.2)

## 2018-08-15 LAB — TSH: TSH: 0.97 u[IU]/mL (ref 0.35–4.50)

## 2018-08-15 LAB — SEDIMENTATION RATE: Sed Rate: 49 mm/hr — ABNORMAL HIGH (ref 0–20)

## 2018-08-15 LAB — URIC ACID: Uric Acid, Serum: 3.4 mg/dL — ABNORMAL LOW (ref 4.0–7.8)

## 2018-08-15 NOTE — Progress Notes (Signed)
Subjective:    Patient ID: MAYSIN CARSTENS, male    DOB: 1940-12-22, 77 y.o.   MRN: [id number]  HPI  77 year old male who  has a past medical history of Alcohol abuse, Arthritis, Depression, Hyperlipidemia, Hypertension, and Kidney stones.  He presents to the office today for labs. He is currently being seen by [facility]. Patient reports that he has an infection in his left eye that has been unable to be cured. He has been on antibiotic eye drops and steroids.   [facility] is concerned for left Scleritis and would like labs drawn   Review of Systems See HPI   Past Medical History:  Diagnosis Date  . Alcohol abuse    10 years ago   . Arthritis   . Depression   . Hyperlipidemia   . Hypertension   . Kidney stones    20 years ago    Social History   Socioeconomic History  . Marital status: Married    Spouse name: Not on file  . Number of children: Not on file  . Years of education: Not on file  . Highest education level: Not on file  Occupational History  . Not on file  Social Needs  . Financial resource strain: Not on file  . Food insecurity:    Worry: Not on file    Inability: Not on file  . Transportation needs:    Medical: Not on file    Non-medical: Not on file  Tobacco Use  . Smoking status: Never Smoker  . Smokeless tobacco: Never Used  Substance and Sexual Activity  . Alcohol use: Yes    Alcohol/week: 0.0 standard drinks    Comment: Wine with dinner  . Drug use: No  . Sexual activity: Not on file  Lifestyle  . Physical activity:    Days per week: Not on file    Minutes per session: Not on file  . Stress: Not on file  Relationships  . Social connections:    Talks on phone: Not on file    Gets together: Not on file    Attends religious service: Not on file    Active member of club or organization: Not on file    Attends meetings of clubs or organizations: Not on file    Relationship status: Not on file  . Intimate partner violence:   Fear of current or ex partner: Not on file    Emotionally abused: Not on file    Physically abused: Not on file    Forced sexual activity: Not on file  Other Topics Concern  . 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     Past Surgical History:  Procedure Laterality Date  . COCHLEAR IMPLANT     bilateral  . HERNIA REPAIR      Family History  Problem Relation Age of Onset  . Heart attack Brother   . Coronary artery disease Brother   . Cancer Father        lung  . Dementia Mother     No Known Allergies  Current Outpatient Medications on File Prior to Visit  Medication Sig Dispense Refill  . amLODipine (NORVASC) 10 MG tablet Take 1 tablet (10 mg total) by mouth daily.  90 tablet 3  . Coenzyme Q10 (CO Q-10 PO) Take by mouth.    . simvastatin (ZOCOR) 10 MG tablet Take 1 tablet (10 mg total) by mouth at bedtime. 90 tablet 3  . tolterodine (DETROL LA) 2 MG 24 hr capsule take 1 capsule by mouth once daily 90 capsule 3  . zolpidem (AMBIEN) 10 MG tablet TAKE 1 TABLET BY MOUTH AT BEDTIME AS NEEDED FOR SLEEP 30 tablet 2   No current facility-administered medications on file prior to visit.     BP 130/60   Temp 98.3 F (36.8 C)   Wt 132 lb (59.9 kg)   BMI 20.07 kg/m       Objective:   Physical Exam  Constitutional: He is oriented to person, place, and time. He appears well-developed and well-nourished. No distress.  Cardiovascular: Normal rate, regular rhythm, normal heart sounds and intact distal pulses.  Pulmonary/Chest: Effort normal and breath sounds normal.  Neurological: He is alert and oriented to person, place, and time.  Skin: Skin is warm and dry. He is not diaphoretic.  Psychiatric: He has a normal mood and affect. His behavior is normal. Judgment and thought content normal.  Nursing note and  vitals reviewed.     Assessment & Plan:  1. Scleritis of left eye  - CBC with Differential/Platelet - Sedimentation Rate - Uric Acid - RPR - ANCA Screen Reflex Titer - Rheumatoid Factor - ANA - HLA-B27 Antigen - B. burgdorfi Antibody - POC Urinalysis Dipstick - Quantiferon tb gold assay - Acetylcholine Receptor Ab, All - TSH - T3, Free - Thyroid Stimulating Immunoglobulin - Fluorescent treponemal ab(fta)-IgG-bld - Angiotensin converting enzyme - DG Chest 2 View; Future  Dorothyann Peng, NP

## 2018-08-17 ENCOUNTER — Encounter: Payer: Self-pay | Admitting: Adult Health

## 2018-08-17 LAB — THYROID STIMULATING IMMUNOGLOBULIN

## 2018-08-17 LAB — RPR: RPR Ser Ql: NONREACTIVE

## 2018-08-17 LAB — HLA-B27 ANTIGEN: HLA-B27 Antigen: NEGATIVE

## 2018-08-17 LAB — ANA: Anti Nuclear Antibody(ANA): NEGATIVE

## 2018-08-17 LAB — RHEUMATOID FACTOR: Rhuematoid fact SerPl-aCnc: <14 IU/mL (ref <14)

## 2018-08-17 LAB — ANGIOTENSIN CONVERTING ENZYME: ANGIOTENSIN-CONVERTING ENZYME: 30 U/L (ref 9–67)

## 2018-08-17 LAB — EXTRA LAV TOP TUBE

## 2018-08-17 LAB — ANCA SCREEN W REFLEX TITER: ANCA SCREEN: NEGATIVE

## 2018-08-17 LAB — B. BURGDORFI ANTIBODIES: B burgdorferi Ab IgG+IgM: <0.9 index

## 2018-08-18 ENCOUNTER — Telehealth: Payer: Self-pay | Admitting: Adult Health

## 2018-08-18 ENCOUNTER — Other Ambulatory Visit: Payer: Self-pay | Admitting: Adult Health

## 2018-08-18 DIAGNOSIS — R899 Unspecified abnormal finding in specimens from other organs, systems and tissues: Secondary | ICD-10-CM

## 2018-08-18 DIAGNOSIS — H10811 Pingueculitis, right eye: Secondary | ICD-10-CM | POA: Diagnosis not present

## 2018-08-18 DIAGNOSIS — H169 Unspecified keratitis: Secondary | ICD-10-CM | POA: Diagnosis not present

## 2018-08-18 DIAGNOSIS — H15092 Other scleritis, left eye: Secondary | ICD-10-CM | POA: Diagnosis not present

## 2018-08-18 LAB — ACETYLCHOLINE RECEPTOR AB, ALL: ACETYLCHOL BLOCK AB: 21 % (ref 0–25)

## 2018-08-18 LAB — QUANTIFERON-TB GOLD PLUS
Mitogen-NIL: >10 IU/mL
NIL: 0.17 [IU]/mL
QuantiFERON-TB Gold Plus: POSITIVE — AB
TB1-NIL: 0.55 IU/mL
TB2-NIL: 0.41 [IU]/mL

## 2018-08-18 NOTE — Telephone Encounter (Signed)
Copied from [place] [id number]. Topic: Quick Communication - Lab Results >> Aug 18, 2018 11:17 AM Oliver Pila B wrote: Reason for CRM: [facility] called to get the latest labs sent over to them, pt is there now; contact if needed [phone] Fax: [phone]

## 2018-08-18 NOTE — Telephone Encounter (Signed)
Updated patients wife on labs that were drawn this week.   Labs faxed to [facility] resulted as Positive. I think this was a false positive.   Will have him come in for retest

## 2018-08-21 ENCOUNTER — Other Ambulatory Visit (INDEPENDENT_AMBULATORY_CARE_PROVIDER_SITE_OTHER): Payer: Medicare Other

## 2018-08-21 DIAGNOSIS — R899 Unspecified abnormal finding in specimens from other organs, systems and tissues: Secondary | ICD-10-CM

## 2018-08-23 NOTE — Telephone Encounter (Signed)
Yes.  I also got fax confirmation that all pages were sent successfully.

## 2018-08-24 LAB — QUANTIFERON-TB GOLD PLUS
Mitogen-NIL: >10 IU/mL
NIL: 0.14 IU/mL
QUANTIFERON-TB GOLD PLUS: POSITIVE — AB
TB1-NIL: 0.52 IU/mL
TB2-NIL: 0.53 IU/mL

## 2018-08-25 ENCOUNTER — Other Ambulatory Visit: Payer: Self-pay | Admitting: Adult Health

## 2018-08-25 DIAGNOSIS — R7612 Nonspecific reaction to cell mediated immunity measurement of gamma interferon antigen response without active tuberculosis: Secondary | ICD-10-CM

## 2018-08-29 ENCOUNTER — Encounter: Payer: Self-pay | Admitting: Internal Medicine

## 2018-08-29 ENCOUNTER — Ambulatory Visit (INDEPENDENT_AMBULATORY_CARE_PROVIDER_SITE_OTHER): Payer: Medicare Other | Admitting: Internal Medicine

## 2018-08-29 VITALS — BP 149/70 | HR 71 | Temp 98.3°F | Wt 130.1 lb

## 2018-08-29 DIAGNOSIS — R05 Cough: Secondary | ICD-10-CM | POA: Diagnosis not present

## 2018-08-29 DIAGNOSIS — H15002 Unspecified scleritis, left eye: Secondary | ICD-10-CM

## 2018-08-29 DIAGNOSIS — A1889 Tuberculosis of other sites: Secondary | ICD-10-CM | POA: Diagnosis not present

## 2018-08-29 DIAGNOSIS — H15102 Unspecified episcleritis, left eye: Secondary | ICD-10-CM | POA: Diagnosis not present

## 2018-08-29 DIAGNOSIS — R7611 Nonspecific reaction to tuberculin skin test without active tuberculosis: Secondary | ICD-10-CM

## 2018-08-29 DIAGNOSIS — R053 Chronic cough: Secondary | ICD-10-CM

## 2018-08-29 DIAGNOSIS — Z227 Latent tuberculosis: Secondary | ICD-10-CM

## 2018-08-29 LAB — CBC WITH DIFFERENTIAL/PLATELET
Basophils Absolute: 50 cells/uL (ref 0–200)
Basophils Relative: 0.9 %
Eosinophils Absolute: 230 cells/uL (ref 15–500)
Eosinophils Relative: 4.1 %
HCT: 40.4 % (ref 38.5–50.0)
Hemoglobin: 14.3 g/dL (ref 13.2–17.1)
Lymphs Abs: 885 cells/uL (ref 850–3900)
MCH: 34 pg — ABNORMAL HIGH (ref 27.0–33.0)
MCHC: 35.4 g/dL (ref 32.0–36.0)
MCV: 96 fL (ref 80.0–100.0)
MPV: 9.4 fL (ref 7.5–12.5)
Monocytes Relative: 14.5 %
NEUTROS PCT: 64.7 %
Neutro Abs: 3623 cells/uL (ref 1500–7800)
Platelets: 239 10*3/uL (ref 140–400)
RBC: 4.21 10*6/uL (ref 4.20–5.80)
RDW: 12.5 % (ref 11.0–15.0)
Total Lymphocyte: 15.8 %
WBC mixed population: 812 cells/uL (ref 200–950)
WBC: 5.6 10*3/uL (ref 3.8–10.8)

## 2018-08-29 LAB — COMPLETE METABOLIC PANEL WITH GFR
AG RATIO: 1.8 (calc) (ref 1.0–2.5)
ALT: 15 U/L (ref 9–46)
AST: 26 U/L (ref 10–35)
Albumin: 4 g/dL (ref 3.6–5.1)
Alkaline phosphatase (APISO): 98 U/L (ref 40–115)
BUN: 13 mg/dL (ref 7–25)
CALCIUM: 9.2 mg/dL (ref 8.6–10.3)
CO2: 29 mmol/L (ref 20–32)
Chloride: 104 mmol/L (ref 98–110)
Creat: 0.84 mg/dL (ref 0.70–1.18)
GFR, EST NON AFRICAN AMERICAN: 85 mL/min/{1.73_m2} (ref >=60)
GFR, Est African American: 99 mL/min/{1.73_m2} (ref >=60)
GLOBULIN: 2.2 g/dL (ref 1.9–3.7)
Glucose, Bld: 100 mg/dL — ABNORMAL HIGH (ref 65–99)
POTASSIUM: 4 mmol/L (ref 3.5–5.3)
Sodium: 141 mmol/L (ref 135–146)
TOTAL PROTEIN: 6.2 g/dL (ref 6.1–8.1)
Total Bilirubin: 0.4 mg/dL (ref 0.2–1.2)

## 2018-08-29 NOTE — Patient Instructions (Signed)
You can reach me through my chart for questions.  Can also reach me through [email or web address]

## 2018-08-29 NOTE — Progress Notes (Addendum)
Patient ID: Michael Miranda, male   DOB: January 30, 1941, 77 y.o.   MRN: [id number]  HPI 77yoM with left eye scelritis . Slightly blurry, not painful. No light sensitive, now wears progressive lenses, far signted mainly.  10 days steroids and 2 wk of steroid eye drops. Now has been off of meds 2 weeks. Vision back to normal. In the meanitme, testing done that showed latent mtb, by QTF testing x 2.  ophtho has given him Dx with nodular scleritis - responded to steroids.  He is here not only for management but also discussion to whether how he was ever in contact with mTB. No hx of travel or work in [place] areas.  Soc hx: Medical transportation, food delivery stopped in 2013.  Worked as disability judge, previously worked in [place], lived in [place].  ? May have had BCG vaccine   Weight loss 5lb unintentional in the last 6-12 months. stablized in the last 2-3 months. Has been thin.    Outpatient Encounter Medications as of 08/29/2018  Medication Sig  . amLODipine (NORVASC) 10 MG tablet Take 1 tablet (10 mg total) by mouth daily.  . Coenzyme Q10 (CO Q-10 PO) Take by mouth.  . simvastatin (ZOCOR) 10 MG tablet Take 1 tablet (10 mg total) by mouth at bedtime.  Marland Kitchen zolpidem (AMBIEN) 10 MG tablet TAKE 1 TABLET BY MOUTH AT BEDTIME AS NEEDED FOR SLEEP  . tolterodine (DETROL LA) 2 MG 24 hr capsule take 1 capsule by mouth once daily   No facility-administered encounter medications on file as of 08/29/2018.     PMHX: hearing loss- thought to be genetic s/p bilateral cochlear implants  Patient Active Problem List   Diagnosis Date Noted  . Hypertension 09/18/2012  . Hypercholesteremia 12/27/2011  . Insomnia 12/27/2011     Health Maintenance Due  Topic Date Due  . INFLUENZA VACCINE  07/06/2018    family history includes Cancer in his father; Coronary artery disease in his brother; Dementia in his mother; Heart attack in his brother. Interestingly all of his siblings have degree of  hearing loss  Review of Systems Physical Exam  Constitutional: He is oriented to person, place, and time. He appears well-developed and well-nourished. No distress.  HENT:  Mouth/Throat: Oropharynx is clear and moist. No oropharyngeal exudate.  Cardiovascular: Normal rate, regular rhythm and normal heart sounds. Exam reveals no gallop and no friction rub.  No murmur heard.  Pulmonary/Chest: Effort normal and breath sounds normal. No respiratory distress. He has no wheezes.  Abdominal: Soft. Bowel sounds are normal. He exhibits no distension. There is no tenderness.  Lymphadenopathy:  He has no cervical adenopathy.  Neurological: He is alert and oriented to person, place, and time.  Skin: Skin is warm and dry. No rash noted. No erythema.  Psychiatric: He has a normal mood and affect. His behavior is normal.    Physical Exam   BP (!) 149/70   Pulse 71   Temp 98.3 F (36.8 C)   Wt 130 lb 1.9 oz (59 kg)   BMI 19.78 kg/m   Physical Exam  Constitutional: He is oriented to person, place, and time. He appears well-developed and well-nourished. No distress.  HENT:  Mouth/Throat: Oropharynx is clear and moist. No oropharyngeal exudate.  Cardiovascular: Normal rate, regular rhythm and normal heart sounds. Exam reveals no gallop and no friction rub.  No murmur heard.  Pulmonary/Chest: Effort normal and breath sounds normal. No respiratory distress. He has no wheezes.  Abdominal:  Soft. Bowel sounds are normal. He exhibits no distension. There is no tenderness.  Lymphadenopathy:  He has no cervical adenopathy.  Neurological: He is alert and oriented to person, place, and time.  Skin: Skin is warm and dry. No rash noted. No erythema.  Psychiatric: He has a normal mood and affect. His behavior is normal.    Lab Results  Component Value Date   LABRPR NON-REACTIVE 08/15/2018    CBC Lab Results  Component Value Date   WBC 4.5 08/15/2018   RBC 4.13 (L) 08/15/2018   HGB 14.1 08/15/2018    HCT 39.9 08/15/2018   PLT 277.0 08/15/2018   MCV 96.5 08/15/2018   MCHC 35.3 08/15/2018   RDW 12.5 08/15/2018   LYMPHSABS 0.7 08/15/2018   MONOABS 0.6 08/15/2018   EOSABS 0.3 08/15/2018    BMET Lab Results  Component Value Date   NA 139 11/22/2017   K 4.0 11/22/2017   CL 101 11/22/2017   CO2 29 11/22/2017   GLUCOSE 82 11/22/2017   BUN 15 11/22/2017   CREATININE 0.98 11/22/2017   CALCIUM 9.2 11/22/2017    Chest xray does not suggest any infiltrates or calcified lesions or cavitary lesions  Assessment and Plan  Scleritis - unclear if the patient's recent episode of scleritis is due to LTBI or if this is presentation of eye disease associated with active mTB. He does not have significant risk factor for mTB. He has mild cough that he attributes to post-nasal drip, denies fever, chills, nightsweats. Had unintentional weight loss of 5lb that has plateaued.  Will plan on getting sputum afb cx x 2 - to see if can isolate as active mTB Urine cx for AFB x 3 - though low yield, it is simple to collect  Will get chest CT - to see if any signs concerning for mTB  If he has recurrence of scleritis - will likely institute 4 drug therapy.   Since not having active symptoms presently, can wait to see what work up shows to decide if need to treat for 4 drug regimen vs. ltbi regimen.  Will test his wife, Opal Sidles with QTF for ease of mind.  Spent 75 min with patient with greater than 50% in discussing further work up and treatment plan

## 2018-08-30 ENCOUNTER — Other Ambulatory Visit: Payer: Medicare Other

## 2018-08-30 DIAGNOSIS — L820 Inflamed seborrheic keratosis: Secondary | ICD-10-CM | POA: Diagnosis not present

## 2018-08-30 DIAGNOSIS — D170 Benign lipomatous neoplasm of skin and subcutaneous tissue of head, face and neck: Secondary | ICD-10-CM | POA: Diagnosis not present

## 2018-08-30 DIAGNOSIS — Z85828 Personal history of other malignant neoplasm of skin: Secondary | ICD-10-CM | POA: Diagnosis not present

## 2018-08-30 DIAGNOSIS — R7611 Nonspecific reaction to tuberculin skin test without active tuberculosis: Secondary | ICD-10-CM | POA: Diagnosis not present

## 2018-08-30 DIAGNOSIS — Z227 Latent tuberculosis: Secondary | ICD-10-CM

## 2018-08-30 DIAGNOSIS — D1801 Hemangioma of skin and subcutaneous tissue: Secondary | ICD-10-CM | POA: Diagnosis not present

## 2018-08-30 DIAGNOSIS — D225 Melanocytic nevi of trunk: Secondary | ICD-10-CM | POA: Diagnosis not present

## 2018-08-30 DIAGNOSIS — L814 Other melanin hyperpigmentation: Secondary | ICD-10-CM | POA: Diagnosis not present

## 2018-08-30 DIAGNOSIS — L821 Other seborrheic keratosis: Secondary | ICD-10-CM | POA: Diagnosis not present

## 2018-08-30 DIAGNOSIS — D2372 Other benign neoplasm of skin of left lower limb, including hip: Secondary | ICD-10-CM | POA: Diagnosis not present

## 2018-08-31 ENCOUNTER — Other Ambulatory Visit: Payer: Medicare Other

## 2018-08-31 DIAGNOSIS — A150 Tuberculosis of lung: Secondary | ICD-10-CM | POA: Diagnosis not present

## 2018-09-01 ENCOUNTER — Ambulatory Visit
Admission: RE | Admit: 2018-09-01 | Discharge: 2018-09-01 | Disposition: A | Discharge status: Home / Self Care | Payer: Medicare Other | Source: Ambulatory Visit | Attending: Internal Medicine | Admitting: Internal Medicine

## 2018-09-01 ENCOUNTER — Other Ambulatory Visit: Payer: Medicare Other

## 2018-09-01 DIAGNOSIS — Z227 Latent tuberculosis: Secondary | ICD-10-CM

## 2018-09-01 DIAGNOSIS — R053 Chronic cough: Secondary | ICD-10-CM

## 2018-09-01 DIAGNOSIS — R05 Cough: Secondary | ICD-10-CM | POA: Diagnosis not present

## 2018-09-04 ENCOUNTER — Other Ambulatory Visit: Payer: Medicare Other

## 2018-09-04 ENCOUNTER — Telehealth: Payer: Self-pay

## 2018-09-04 DIAGNOSIS — A159 Respiratory tuberculosis unspecified: Secondary | ICD-10-CM

## 2018-09-04 NOTE — Telephone Encounter (Signed)
Patients wife called today to go over Ct Chest Scan with Dr. Baxter Flattery. Will route message to Dr. Baxter Flattery to let her know patient is still waiting on call regarding results. [place]

## 2018-09-05 ENCOUNTER — Telehealth: Payer: Self-pay | Admitting: Internal Medicine

## 2018-09-05 NOTE — Telephone Encounter (Signed)
Unable to leave message when calling their home over the last 2 days to give results of chest CT.

## 2018-09-05 NOTE — Telephone Encounter (Signed)
I have been unable to reach them. If they call again, please feel free to give them my cell number [phone]

## 2018-09-05 NOTE — Telephone Encounter (Signed)
Unable to reach patient at this time. Left a voicemail for patient to call office back to speak with Dr. Baxter Flattery at [phone]. [place]

## 2018-09-05 NOTE — Telephone Encounter (Signed)
Patient returned call from Dr Baxter Flattery. Advised will let her know he called.

## 2018-09-07 NOTE — Telephone Encounter (Signed)
I spoke to patient to give results. And wait for current results from sputum culture

## 2018-09-11 ENCOUNTER — Other Ambulatory Visit: Payer: Medicare Other

## 2018-09-11 DIAGNOSIS — H01006 Unspecified blepharitis left eye, unspecified eyelid: Secondary | ICD-10-CM | POA: Diagnosis not present

## 2018-09-11 DIAGNOSIS — A159 Respiratory tuberculosis unspecified: Secondary | ICD-10-CM | POA: Diagnosis not present

## 2018-09-13 MED ORDER — AMLODIPINE BESYLATE 5 MG PO TABS: 5.0000 mg | ORAL_TABLET | Freq: Every day | ORAL | 3 refills | Status: DC

## 2018-09-22 ENCOUNTER — Telehealth: Payer: Self-pay | Admitting: *Deleted

## 2018-09-22 NOTE — Telephone Encounter (Signed)
Patient wife called to advise they were waiting to get a call about a follow up appointment. After review of the schedule Dr Baxter Flattery is not available until after November 1 and they do not want to wait that long. Advised will check with Baxter Flattery and give them a call back to schedule when she lets me know what day to overbook if it is ok to do so.

## 2018-09-22 NOTE — Telephone Encounter (Signed)
Can you book him in 3 wks. Can overbook. Can you let them know that we are in the waiting mode. We are waiting to see if any cultures grow to decide if need to treat for active TB. Assure them, I have not forgotten about them, but as of now (roughly 3 weeks- all things are negative) which is good news. At the 6wk mark from when we had collected samples, that will tell us if any has grown.

## 2018-09-27 ENCOUNTER — Other Ambulatory Visit: Payer: Self-pay | Admitting: Adult Health

## 2018-09-27 DIAGNOSIS — G4700 Insomnia, unspecified: Secondary | ICD-10-CM

## 2018-09-27 MED ORDER — ZOLPIDEM TARTRATE 10 MG PO TABS: 10.0000 mg | ORAL_TABLET | Freq: Every evening | ORAL | 2 refills | Status: DC | PRN

## 2018-09-27 NOTE — Telephone Encounter (Signed)
Copied from [place] [id number]. Topic: Quick Communication - Rx Refill/Question >> Sep 27, 2018  9:06 AM Gardiner Ramus wrote: Medication: zolpidem (AMBIEN) 10 MG tablet [[id number]]   Has the patient contacted their pharmacy? yes Preferred Pharmacy (with phone number or street name):[facility] [phone] - [place], [place] - [phone] (Phone) [phone] (Fax)   Agent: Please be advised that RX refills may take up to 3 business days. We ask that you follow-up with your pharmacy.

## 2018-09-27 NOTE — Telephone Encounter (Signed)
Requested medication (s) are due for refill today: yes  Requested medication (s) are on the active medication list: yes    Last refill: 04/18/18  #30  2 refills  Future visit scheduled no  Notes to clinic:Not delegated  Requested Prescriptions  Pending Prescriptions Disp Refills   zolpidem (AMBIEN) 10 MG tablet 30 tablet 2    Sig: Take 1 tablet (10 mg total) by mouth at bedtime as needed. for sleep     Not Delegated - Psychiatry:  Anxiolytics/Hypnotics Failed - 09/27/2018  9:16 AM      Failed - This refill cannot be delegated      Failed - Urine Drug Screen completed in last 360 days.      Passed - Valid encounter within last 6 months    Recent Outpatient Visits          1 month ago Scleritis of left eye   Therapist, music at [facility], [place], NP   10 months ago Essential hypertension   Therapist, music at [facility], [place], NP   1 year ago Skin lesion of Airline pilot at [facility], [place], [place]   1 year ago Essential hypertension   Therapist, music at [facility], [place], NP   2 years ago Essential hypertension   Therapist, music at [facility], [place], [place]

## 2018-10-10 DIAGNOSIS — H01006 Unspecified blepharitis left eye, unspecified eyelid: Secondary | ICD-10-CM | POA: Diagnosis not present

## 2018-10-16 LAB — MYCOBACTERIA,CULT W/FLUOROCHROME SMEAR
MICRO NUMBER: 91155903
SMEAR:: NONE SEEN
SPECIMEN QUALITY: ADEQUATE

## 2018-10-17 NOTE — Telephone Encounter (Addendum)
Patient finally called back and left a message. Tried to get him back on the line and he did not answer and his phone does not have voice mail set up. Will try to get him again to schedule a follow up.  Patient called back while finishing up note and advised per [facility] labs have been negative and she will treat for Latent TB but that he can see her or her partners for the follow up appointment. He advised he understands and asked what this means for his wife. Advised him the provider will discuss that at his visit.   Set him up to see [place] 10/24/18 at 1130 as he did not want to wait until 11/15/18 to see [facility].

## 2018-10-18 LAB — MYCOBACTERIA,CULT W/FLUOROCHROME SMEAR
MICRO NUMBER:: 91161570
MICRO NUMBER:: 91174973
SMEAR:: NONE SEEN
SMEAR:: NONE SEEN
SPECIMEN QUALITY: ADEQUATE
SPECIMEN QUALITY:: ADEQUATE

## 2018-10-24 ENCOUNTER — Ambulatory Visit (INDEPENDENT_AMBULATORY_CARE_PROVIDER_SITE_OTHER): Payer: Medicare Other | Admitting: Internal Medicine

## 2018-10-24 ENCOUNTER — Encounter: Payer: Self-pay | Admitting: Internal Medicine

## 2018-10-24 DIAGNOSIS — E7800 Pure hypercholesterolemia, unspecified: Secondary | ICD-10-CM

## 2018-10-24 DIAGNOSIS — Z227 Latent tuberculosis: Secondary | ICD-10-CM | POA: Diagnosis not present

## 2018-10-24 DIAGNOSIS — Z5181 Encounter for therapeutic drug level monitoring: Secondary | ICD-10-CM

## 2018-10-24 MED ORDER — RIFAMPIN 300 MG PO CAPS: 600.0000 mg | ORAL_CAPSULE | Freq: Every day | ORAL | 3 refills | Status: DC

## 2018-10-24 NOTE — Assessment & Plan Note (Signed)
simvistatin not on list of interactions but may decrease it some.  No history of CAD

## 2018-10-24 NOTE — Assessment & Plan Note (Signed)
I did discuss potential side effects of the medication and dye color and will check his LFTs in 2 weeks

## 2018-10-24 NOTE — Progress Notes (Signed)
   Subjective:    Patient ID: Michael Miranda, male    DOB: 11-Oct-1941, 77 y.o.   MRN: [id number]  HPI He is here for follow up of latent Tb.   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  This has done well and pretty much resolved.  He though was tested for QuantiFERON gold which was positive twice.  He also had had some weight loss in the past which has stabilized and concern for potential active tuberculosis and underwent a CT scan of his lungs as well as AFB sputum samples 3 times.  He additionally had a urine AFB culture done. All 3 of his sputum AFB cultures have remained negative now at 6 weeks, his urine AFB also is negative and his CT scan just shows some old granulomatous scarring with nothing active.  He does have now URI-like symptoms but otherwise is feeling well.  He is interested in treatment for latent TB.   Review of Systems  Constitutional: Negative for chills, fatigue, fever and unexpected weight change.  Gastrointestinal: Negative for diarrhea and nausea.  Skin: Negative for rash.       Objective:   Physical Exam  Constitutional: He appears well-developed and well-nourished. No distress.  Eyes: No scleral icterus.  Cardiovascular: Normal rate, regular rhythm and normal heart sounds.  No murmur heard. Pulmonary/Chest: Effort normal and breath sounds normal. No respiratory distress.  Skin: No rash noted.   SH: no tobacco       Assessment & Plan:

## 2018-10-24 NOTE — Assessment & Plan Note (Addendum)
Scarring in lung, no positive sputum, not c/w active Tb. All results discussed with the patient and his wife.  I discussed treatment for latent tb to reduce his likelihood of developing active disease later and will start rifampin 600 mg daily for 4 months.   He will return in 2 weeks with me and I will check his LFTs He is aware of discoloration of urine, mucous membranes

## 2018-10-30 LAB — MYCOBACTERIA,CULT W/FLUOROCHROME SMEAR
MICRO NUMBER: 91205150
SMEAR:: NONE SEEN
SPECIMEN QUALITY:: ADEQUATE

## 2018-11-07 ENCOUNTER — Encounter: Payer: Self-pay | Admitting: Internal Medicine

## 2018-11-07 ENCOUNTER — Ambulatory Visit (INDEPENDENT_AMBULATORY_CARE_PROVIDER_SITE_OTHER): Payer: Medicare Other | Admitting: Internal Medicine

## 2018-11-07 VITALS — BP 200/78 | HR 64 | Temp 97.9°F | Wt 140.0 lb

## 2018-11-07 DIAGNOSIS — Z5181 Encounter for therapeutic drug level monitoring: Secondary | ICD-10-CM

## 2018-11-07 DIAGNOSIS — Z227 Latent tuberculosis: Secondary | ICD-10-CM | POA: Diagnosis not present

## 2018-11-07 DIAGNOSIS — R21 Rash and other nonspecific skin eruption: Secondary | ICD-10-CM | POA: Diagnosis not present

## 2018-11-07 LAB — COMPLETE METABOLIC PANEL WITH GFR
AG Ratio: 1.9 (calc) (ref 1.0–2.5)
ALKALINE PHOSPHATASE (APISO): 86 U/L (ref 40–115)
ALT: 14 U/L (ref 9–46)
AST: 26 U/L (ref 10–35)
Albumin: 4.1 g/dL (ref 3.6–5.1)
BUN: 19 mg/dL (ref 7–25)
CHLORIDE: 103 mmol/L (ref 98–110)
CO2: 29 mmol/L (ref 20–32)
Calcium: 9.2 mg/dL (ref 8.6–10.3)
Creat: 0.82 mg/dL (ref 0.70–1.18)
GFR, Est African American: 99 mL/min/{1.73_m2} (ref >=60)
GFR, Est Non African American: 85 mL/min/{1.73_m2} (ref >=60)
Globulin: 2.2 g/dL (calc) (ref 1.9–3.7)
Glucose, Bld: 98 mg/dL (ref 65–99)
Potassium: 4.6 mmol/L (ref 3.5–5.3)
Sodium: 139 mmol/L (ref 135–146)
Total Bilirubin: 0.7 mg/dL (ref 0.2–1.2)
Total Protein: 6.3 g/dL (ref 6.1–8.1)

## 2018-11-07 NOTE — Assessment & Plan Note (Signed)
Rash does not look like a typical drug rash.  I suspect it is more related to some bites.  He will call if it spreads or with new concerns.

## 2018-11-07 NOTE — Assessment & Plan Note (Signed)
I will check his LFTs today

## 2018-11-07 NOTE — Progress Notes (Signed)
   Subjective:    Patient ID: Michael Miranda, male    DOB: [birth year], 77 y.o.   MRN: [id number]  HPI He is here for follow up of latent Tb.   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  This has done well and pretty much resolved.  He though was tested for QuantiFERON gold which was positive twice.  He also had had some weight loss in the past which has stabilized and concern for potential active tuberculosis and underwent a CT scan of his lungs as well as AFB sputum samples 3 times.  He additionally had a urine AFB culture done. All 3 of his sputum AFB cultures have remained negative, his urine AFB also is negative and his CT scan just shows some old granulomatous scarring with nothing active.   I then started him on treatment for latent Tb with rifampin for 4 months.  He remains anxious about the treatment but has been taking it for 2 weeks.  He has noted some itching of his bilateral lower extremities.  He has noted expected color changes in his urine.  He has multiple questions.  Asks if INH would be a better option.  Asks if itching could be from medication.  Asks if he can take a lower dose or split up the pills to one twice a day.    Review of Systems  Constitutional: Negative for chills, fatigue, fever and unexpected weight change.  Gastrointestinal: Negative for diarrhea and nausea.  Skin: Positive for rash.       Itch       Objective:   Physical Exam  Constitutional: He appears well-developed and well-nourished. No distress.  Eyes: No scleral icterus.  Cardiovascular: Normal rate, regular rhythm and normal heart sounds.  No murmur heard. Pulmonary/Chest: Effort normal and breath sounds normal. No respiratory distress.  Skin: No rash noted.  His legs have small papillary areas with scratch marks, no hives, no maculopapular rash   SH: no tobacco       Assessment & Plan:

## 2018-11-07 NOTE — Assessment & Plan Note (Signed)
He will continue with rifampin for 4 months, he will call if he has any new issues.

## 2018-11-08 ENCOUNTER — Telehealth: Payer: Self-pay | Admitting: Cardiovascular Disease

## 2018-11-08 MED ORDER — LISINOPRIL 10 MG PO TABS: 10.0000 mg | ORAL_TABLET | Freq: Every day | ORAL | 3 refills | Status: DC

## 2018-11-08 NOTE — Telephone Encounter (Signed)
Pt c/o BP issue: STAT if pt c/o blurred vision, one-sided weakness or slurred speech  1. What are your last 5 BP readings? 6:30 am 201/78, 8:30 191/90  2. Are you having any other symptoms (ex. Dizziness, headache, blurred vision, passed out)? Dizziness and tired   3. What is your BP issue? BP is staying high

## 2018-11-08 NOTE — Telephone Encounter (Signed)
Spoke to patient who informed me that his BP @ 12 noon today 12/4 was 150/79.    I told him to check it again tonight @ 6 pm and tomorrow morning well after he takes his morning Amlodipine 5 mg and call us with an update. He and his wife verbalized understanding.

## 2018-11-08 NOTE — Telephone Encounter (Signed)
Spoke to the patient and his wife.  I gave them Dr Antionette Char recommendation to start Lisinopril 10 mg daily.  We will also set him up for a PharmD appt.  They verbalized understanding.

## 2018-11-08 NOTE — Telephone Encounter (Signed)
Patient returned call.   Patient states his current BP is 150/79.

## 2018-11-08 NOTE — Addendum Note (Signed)
Addended by: Frederik Schmidt on: 11/08/2018 01:17 PM   Modules accepted: Orders

## 2018-11-08 NOTE — Telephone Encounter (Signed)
I would recommend adding lisinopril 10 mg daily to his regimen. Please arrange a PharmD visit next week for BP management/med titration. He has tolerated ACE inhibitors in the past. thx

## 2018-11-08 NOTE — Telephone Encounter (Signed)
Spoke to patient and his wife who is concerned about his BP.  This morning @ 6:30am he checked his BP and it was 201/78.  He then took Amlodipine 5 mg.    He checked his BP @ 8:30 am 191/90, he then took another 5 mg of Amlodipine.  At 9:00 am,  still concerned about his BP, he took another 5 mg of Amlodipine.  I told him to check his BP @ 12 and call us to inform us of its' value.  He is presently asymptomatic  (no headache, blurred vision, etc)

## 2018-11-09 ENCOUNTER — Telehealth: Payer: Self-pay | Admitting: Cardiovascular Disease

## 2018-11-09 NOTE — Telephone Encounter (Signed)
Pt and his wife have agreed with medication plan and verbalized understanding.

## 2018-11-09 NOTE — Telephone Encounter (Signed)
Would continue for now. However, if his BP is intermittently below 120 mmHg, would reduce lisinopril by 50% (take 1/2 of a pill)

## 2018-11-09 NOTE — Telephone Encounter (Signed)
Pt c/o BP issue:  1. What are your last 5 BP readings? 126/68 67, 114/63 71, today 168/91, 174/85, 160/89 after taking medication. 2. Are you having any other symptoms (ex. Dizziness, headache, blurred vision, passed out)? Blurred vision 3. What is your medication issue? Patient started to take new medication.

## 2018-11-09 NOTE — Telephone Encounter (Signed)
Pt and his wife calling today to report BP changes since starting lisinopril.  Pt called yesterday and his SBP was noted to be in the 200's. He was started on 10mg  lisinopril qd. His BP then dropped to 126/68 about an hour after taking. Around 9:50pm, his wife states pt was unable to answer the questions "Who is the oldest person in the world?" He answered by saying "2019" and "2014." She states he was able to repeat the question but could not answer appropriately. She checked his BP and it read 114/63.   Today pt's BP started at 168/91. He took his BP meds around 0900 and his BP was 160/89 @ 1100.   I have advised pt and his wife that if he begins to be confused and unable to answer questions again she should call 911 so he might be further evaluated.   One thing of note is pt was recently started on Rifampin for dormant TB. According to [employer], rifampin interacts with amlodipine causing it to have less effectiveness. This may be why his BP has been elevated.   "Rifampin induces certain MLJ449 enzymes and has been reported to accelerate the metabolism of some calcium channel blockers (Prod Info RIFADIN oral capsules, 2018), including amlodipine. Monitor blood pressure when amlodipine is co-administered with CYP3A inducers (Prod Info NORVASC oral tablets, 2017). Dosages of drugs metabolized by these enzymes may require adjustment when starting or stopping concomitantly administered rifampin (Prod Info RIFADIN oral capsules, 2018) or use an alternative to rifampin, when possible Darrall Dears et al, 2016)."  I suggested that pt call his ID office to be sure his neuro changes were not caused by his rifampin as his BP was normal at the time.   I advised pt to remain on his lisinopril until notified otherwise. I will forward to Dr Burt Knack and his RN for any further recommendation.  Pt verbalized understanding and had no additional questions.

## 2018-11-13 ENCOUNTER — Ambulatory Visit (INDEPENDENT_AMBULATORY_CARE_PROVIDER_SITE_OTHER): Payer: Medicare Other | Admitting: Pharmacist

## 2018-11-13 VITALS — BP 158/80 | HR 64 | Resp 16

## 2018-11-13 DIAGNOSIS — I10 Essential (primary) hypertension: Secondary | ICD-10-CM

## 2018-11-13 NOTE — Progress Notes (Signed)
Patient ID: Michael Miranda                 DOB: 1941/08/01                      MRN: [id number]     HPI: Michael Miranda is a 77 y.o. male patient of Dr. Burt Knack who presents today for hypertension evaluation. PMH significant for HTN, HLD, alcohol abuse 10 years ago. His lisinopril was recently restarted.   He presents today alone. He states his blood pressure has been increasing over the last few months. He believes that on some level this is related to stress. He was recently started on rifampin for latent TB. He states this has been very stressful for him and his wife as he is unsure when he was exposed to TB and was shocked when 2 cultures were positive. He also reports that he has a new rash on his legs. He believes this is a drug-rash, but was told by ID doc that this is likely bug bites. He reports that if so he believes that they may be flea bites as he occasionally walks in the grass, but does not own a pet.   Current HTN meds:  Amlodipine 5mg  daily  Lisinopril 10mg  daily   Previously tried: higher doses of amlodipine (tired and nauseated)  BP goal: <130/80  Family History: Cancer in his father; Coronary artery disease in his brother; Dementia in his mother; Heart attack in his brother  Social History: The patient  reports that he has never smoked. He has never used smokeless tobacco. He reports that he drinks alcohol. He reports that he does not use drugs.  Diet: He eats mostly from home. He tries to eat healthy food. He does eat vegetables regularly. He does not add salt to food. He does use some sauces. He drinks mostly water, 1 cup per day coffee. He unsweet tea 2 glasses per day.   Exercise: He has not been exercising the last 2-3 weeks. He walks generally about 1 mile.   Home BP readings: 117/179/79-89 HR 60s  Wt Readings from Last 3 Encounters:  11/07/18 140 lb (63.5 kg)  10/24/18 138 lb (62.6 kg)  08/29/18 130 lb 1.9 oz (59 kg)   BP Readings from Last 3 Encounters:    11/13/18 (!) 158/80  11/07/18 (!) 200/78  10/24/18 (!) 144/85   Pulse Readings from Last 3 Encounters:  11/13/18 64  11/07/18 64  10/24/18 65    Renal function: Estimated Creatinine Clearance: 67.8 mL/min (by C-G formula based on SCr of 0.82 mg/dL).  Past Medical History:  Diagnosis Date  . Alcohol abuse    10 years ago   . Arthritis   . Depression   . Hyperlipidemia   . Hypertension   . Kidney stones    20 years ago    Current Outpatient Medications on File Prior to Visit  Medication Sig Dispense Refill  . amLODipine (NORVASC) 5 MG tablet Take 1 tablet (5 mg total) by mouth daily. 90 tablet 3  . Coenzyme Q10 (CO Q-10 PO) Take by mouth.    Marland Kitchen lisinopril (PRINIVIL,ZESTRIL) 20 MG tablet Take 1 tablet (20 mg total) by mouth daily.    . rifampin (RIFADIN) 300 MG capsule Take 2 capsules (600 mg total) by mouth daily. 60 capsule 3  . simvastatin (ZOCOR) 10 MG tablet Take 1 tablet (10 mg total) by mouth at bedtime. 90 tablet 3  . zolpidem (AMBIEN)  10 MG tablet Take 1 tablet (10 mg total) by mouth at bedtime as needed. for sleep 30 tablet 2   No current facility-administered medications on file prior to visit.     No Known Allergies  Blood pressure (!) 158/80, pulse 64, resp. rate 16.   Assessment/Plan: Hypertension: BP today is above goal <130/80. Will increase lisinopril to 20mg  daily. Advised to continue to monitor and follow up in 4 weeks for repeat BMET and BP check.    Thank you, Lelan Pons. Patterson Hammersmith, [place]

## 2018-11-13 NOTE — Patient Instructions (Addendum)
Return for a follow up appointment in 4 weeks  Go to the lab with follow up visit  Check your blood pressure at home daily (if able) and keep record of the readings.  Take your BP meds as follows: INCREASE lisinopril to 20mg  (you may take 2 tablets of your current supply)  CONTINUE amlodipine 5mg  daily, but take in the evening  Bring all of your meds, your BP cuff and your record of home blood pressures to your next appointment.  Exercise as you're able, try to walk approximately 30 minutes per day.  Keep salt intake to a minimum, especially watch canned and prepared boxed foods.  Eat more fresh fruits and vegetables and fewer canned items.  Avoid eating in fast food restaurants.    HOW TO TAKE YOUR BLOOD PRESSURE: . Rest 5 minutes before taking your blood pressure. .  Don't smoke or drink caffeinated beverages for at least 30 minutes before. . Take your blood pressure before (not after) you eat. . Sit comfortably with your back supported and both feet on the floor (don't cross your legs). . Elevate your arm to heart level on a table or a desk. . Use the proper sized cuff. It should fit smoothly and snugly around your bare upper arm. There should be enough room to slip a fingertip under the cuff. The bottom edge of the cuff should be 1 inch above the crease of the elbow. . Ideally, take 3 measurements at one sitting and record the average.

## 2018-11-21 ENCOUNTER — Other Ambulatory Visit: Payer: Self-pay | Admitting: Adult Health

## 2018-11-21 DIAGNOSIS — G4700 Insomnia, unspecified: Secondary | ICD-10-CM

## 2018-11-21 NOTE — Telephone Encounter (Signed)
Copied from [place] [id number]. Topic: Quick Communication - Rx Refill/Question >> Nov 21, 2018  9:03 AM Keene Breath wrote: Medication: zolpidem (AMBIEN) 10 MG tablet  Patient called to request a refill for the above medication  Preferred Pharmacy (with phone number or street name): [facility] [phone] - [place], [place] - [address] [phone] (Phone) [phone] (Fax)

## 2018-11-21 NOTE — Telephone Encounter (Signed)
Requested medication (s) are due for refill today: due 12/28/2018  Requested medication (s) are on the active medication list: yes    Last refill: 09/27/18  #30  2 refills  Future visit scheduled no  Notes to clinic:not delegated  Requested Prescriptions  Pending Prescriptions Disp Refills   zolpidem (AMBIEN) 10 MG tablet 30 tablet 2    Sig: Take 1 tablet (10 mg total) by mouth at bedtime as needed. for sleep     Not Delegated - Psychiatry:  Anxiolytics/Hypnotics Failed - 11/21/2018  9:25 AM      Failed - This refill cannot be delegated      Failed - Urine Drug Screen completed in last 360 days.      Passed - Valid encounter within last 6 months    Recent Outpatient Visits          3 months ago Scleritis of left eye   Therapist, music at [facility], [place], NP   12 months ago Essential hypertension   Therapist, music at [facility], [place], NP   1 year ago Skin lesion of Airline pilot at [facility], [place], MD   2 years ago Essential hypertension   Therapist, music at [facility], [place], NP   2 years ago Essential hypertension   Therapist, music at [facility], [place], [place]

## 2018-11-21 NOTE — Telephone Encounter (Signed)
Per Ria Comment at the pharmacy, pt has refills on file.  This request is not needed.  Denied.  Nothing further needed.

## 2018-12-11 ENCOUNTER — Other Ambulatory Visit: Payer: Medicare Other | Admitting: *Deleted

## 2018-12-11 ENCOUNTER — Ambulatory Visit (INDEPENDENT_AMBULATORY_CARE_PROVIDER_SITE_OTHER): Payer: Medicare Other | Admitting: Pharmacist

## 2018-12-11 VITALS — BP 168/82 | HR 63

## 2018-12-11 DIAGNOSIS — I10 Essential (primary) hypertension: Secondary | ICD-10-CM

## 2018-12-11 LAB — HEPATIC FUNCTION PANEL
ALT: 14 IU/L (ref 0–44)
AST: 24 IU/L (ref 0–40)
Albumin: 4.4 g/dL (ref 3.5–4.8)
Alkaline Phosphatase: 89 IU/L (ref 39–117)
Bilirubin Total: 0.7 mg/dL (ref 0.0–1.2)
Bilirubin, Direct: 0.29 mg/dL (ref 0.00–0.40)
Total Protein: 6 g/dL (ref 6.0–8.5)

## 2018-12-11 LAB — BASIC METABOLIC PANEL
BUN/Creatinine Ratio: 14 (ref 10–24)
BUN: 14 mg/dL (ref 8–27)
CO2: 21 mmol/L (ref 20–29)
Calcium: 8.8 mg/dL (ref 8.6–10.2)
Chloride: 97 mmol/L (ref 96–106)
Creatinine, Ser: 1.02 mg/dL (ref 0.76–1.27)
GFR calc Af Amer: 82 mL/min/{1.73_m2} (ref >59)
GFR calc non Af Amer: 71 mL/min/{1.73_m2} (ref >59)
Glucose: 97 mg/dL (ref 65–99)
Potassium: 4.5 mmol/L (ref 3.5–5.2)
Sodium: 136 mmol/L (ref 134–144)

## 2018-12-11 MED ORDER — LISINOPRIL 40 MG PO TABS: 40.0000 mg | ORAL_TABLET | Freq: Every day | ORAL | 3 refills | Status: DC

## 2018-12-11 NOTE — Progress Notes (Signed)
Patient ID: [name]                 DOB: [birth year]                      MRN: [id number]     HPI: Michael Miranda is a 78 y.o. male patient of Dr. Burt Miranda who presents today for hypertension evaluation. PMH significant for HTN, HLD, alcohol abuse 10 years ago. His lisinopril was recently increased to 20 mg daily as BP has been increasing over the last few months.  Patient presents today in good spirits. Reports taking lisinopril 20 mg daily and denies side effects (imbalance, dizziness, headache, vision changes, and falls). Patient does not take NSAIDs for pain medications. He takes lisinopril in the morning and amlodipine in the evening. Patient checks BP few hours after taking pills in the morning. At home, patient checks BP daily using Omron bicep cuff which is about 2-14 years old.   In clinic, BP reads 168/90 using his home BP cuff and reads 168/82 using clinic BP cuff, confirming the accuracy of his BP home cuff. Both of the readings in clinic are much higher than his home BP readings. Looking at the past clinic BP readings, the numbers are consistently high. Pt reports that his blood pressure usually runs higher when he is at the doctor's. Educated patient on proper technique for checking BP at home (flat feet and resting for 5-10 minutes).  Current HTN meds:  Amlodipine 5mg  daily  Lisinopril 20mg  daily   Previously tried: higher doses of amlodipine (tired and nauseated)  BP goal: <130/80  Family History: Cancer in his father; Coronary artery disease in his brother; Dementia in his mother; Heart attack in his brother  Social History: The patient  reports that he has never smoked. He has never used smokeless tobacco. He reports that he drinks alcohol. He reports that he does not use drugs.  Diet: He eats mostly from home. He tries to eat healthy food. He does eat vegetables regularly. He does not add salt to food. He does use some sauces. He drinks mostly water, 1 cup per day  coffee. He unsweet tea 2 glasses per day.   Exercise: He has not been exercising the last 2-3 weeks. He walks generally about 1 mile.   Home BP readings: lowest (12/06/18) BP 123/72, HR 65; highest (12/03/18) BP 160/86. Most of the readings are in the 130-140/70s  Wt Readings from Last 3 Encounters:  11/07/18 140 lb (63.5 kg)  10/24/18 138 lb (62.6 kg)  08/29/18 130 lb 1.9 oz (59 kg)   BP Readings from Last 3 Encounters:  11/13/18 (!) 158/80  11/07/18 (!) 200/78  10/24/18 (!) 144/85   Pulse Readings from Last 3 Encounters:  11/13/18 64  11/07/18 64  10/24/18 65    Renal function: CrCl cannot be calculated (Patient's most recent lab result is older than the maximum 21 days allowed.).  Past Medical History:  Diagnosis Date  . Alcohol abuse    10 years ago   . Arthritis   . Depression   . Hyperlipidemia   . Hypertension   . Kidney stones    20 years ago    Current Outpatient Medications on File Prior to Visit  Medication Sig Dispense Refill  . amLODipine (NORVASC) 5 MG tablet Take 1 tablet (5 mg total) by mouth daily. 90 tablet 3  . Coenzyme Q10 (CO Q-10 PO) Take by mouth.    Marland Kitchen  lisinopril (PRINIVIL,ZESTRIL) 20 MG tablet Take 1 tablet (20 mg total) by mouth daily.    . rifampin (RIFADIN) 300 MG capsule Take 2 capsules (600 mg total) by mouth daily. 60 capsule 3  . simvastatin (ZOCOR) 10 MG tablet Take 1 tablet (10 mg total) by mouth at bedtime. 90 tablet 3  . zolpidem (AMBIEN) 10 MG tablet Take 1 tablet (10 mg total) by mouth at bedtime as needed. for sleep 30 tablet 2   No current facility-administered medications on file prior to visit.     No Known Allergies  There were no vitals taken for this visit.   Assessment/Plan:  1. Hypertension: BP today is above goal <130/80. Patient has white coat HTN, which clinic BP reading 28mmHg higher than his home BP and accuracy of home BP cuff has been confirmed. Will check BMET today due to recent dose increase of lisinopril.  Pending BMET results, wiill either increase lisinopril to 40 mg or initiate lisinopril/HCTZ 20/12.5 mg daily and continue amlodipine 5 mg daily. Will call the patient with a plan once potassium level from lab is back today. Advised to continue to monitor and follow up in 4 weeks for repeat BMET and BP check.   Seen in clinic by Michael Miranda, PharmD Candidate   E. Supple, PharmD, BCACP, [place] [address] [place], [place] [address] Phone: [phone]; Fax: [phone] 12/11/2018 11:44 AM  Addendum: BMET stable, will increase lisinopril to 40mg  once daily. Keep follow up in 4 weeks as scheduled. Pt is aware.

## 2018-12-11 NOTE — Patient Instructions (Addendum)
It was nice to see you today  Depending on your lab work, we will either increase your lisinopril to 40mg  daily or switch your lisinopril to a combination pill with hydrochlorothiazide (HCTZ)  Continue taking your amlodipine in the evening  Continue to monitor your blood pressure at home - your goal is < 130/16mmHg  Follow up in clinic in 4 weeks for a blood pressure check

## 2018-12-12 ENCOUNTER — Encounter: Payer: Self-pay | Admitting: Internal Medicine

## 2018-12-12 ENCOUNTER — Ambulatory Visit (INDEPENDENT_AMBULATORY_CARE_PROVIDER_SITE_OTHER): Payer: Medicare Other | Admitting: Internal Medicine

## 2018-12-12 DIAGNOSIS — Z227 Latent tuberculosis: Secondary | ICD-10-CM

## 2018-12-12 DIAGNOSIS — Z5181 Encounter for therapeutic drug level monitoring: Secondary | ICD-10-CM | POA: Diagnosis not present

## 2018-12-12 NOTE — Progress Notes (Signed)
   Subjective:    Patient ID: Michael Miranda, male    DOB: 1941-06-20, 78 y.o.   MRN: [id number]  HPI He is here for follow up of latent Tb.   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  This has done well and pretty much resolved.  He though was tested for QuantiFERON gold which was positive twice.  He also had had some weight loss in the past which has stabilized and concern for potential active tuberculosis and underwent a CT scan of his lungs as well as AFB sputum samples 3 times.  He additionally had a urine AFB culture done. All 3 of his sputum AFB cultures remained negative, his urine AFB also is negative and his CT scan just shows some old granulomatous scarring with nothing active.   I then started him on treatment for latent Tb with rifampin for 4 months. He has been on it about 6 weeks and had follow up LFTs yesterday by his PCP and are wnl.  He still has some itching but no rash now.  No associated n/v/d.     Review of Systems  Constitutional: Negative for chills, fatigue, fever and unexpected weight change.  Respiratory: Negative for cough and shortness of breath.   Gastrointestinal: Negative for diarrhea and nausea.  Skin: Negative for rash.       Itch       Objective:   Physical Exam Constitutional:      General: He is not in acute distress.    Appearance: He is well-developed.  Eyes:     General: No scleral icterus. Skin:    Findings: No rash.  Neurological:     General: No focal deficit present.           Assessment & Plan:

## 2018-12-12 NOTE — Assessment & Plan Note (Addendum)
Normal LFTs reviewed.  No changes or further monitoring indicated

## 2018-12-12 NOTE — Assessment & Plan Note (Signed)
Doing well on rifampin and will complete 4 months.  He will return after treatment completion.

## 2019-01-09 ENCOUNTER — Ambulatory Visit (INDEPENDENT_AMBULATORY_CARE_PROVIDER_SITE_OTHER): Payer: Medicare Other | Admitting: Pharmacist

## 2019-01-09 VITALS — BP 130/72 | HR 65

## 2019-01-09 DIAGNOSIS — I10 Essential (primary) hypertension: Secondary | ICD-10-CM

## 2019-01-09 LAB — BASIC METABOLIC PANEL
BUN/Creatinine Ratio: 18 (ref 10–24)
BUN: 17 mg/dL (ref 8–27)
CHLORIDE: 100 mmol/L (ref 96–106)
CO2: 23 mmol/L (ref 20–29)
Calcium: 9.1 mg/dL (ref 8.6–10.2)
Creatinine, Ser: 0.95 mg/dL (ref 0.76–1.27)
GFR calc Af Amer: 89 mL/min/{1.73_m2} (ref >59)
GFR calc non Af Amer: 77 mL/min/{1.73_m2} (ref >59)
Glucose: 85 mg/dL (ref 65–99)
Potassium: 4.9 mmol/L (ref 3.5–5.2)
Sodium: 137 mmol/L (ref 134–144)

## 2019-01-09 NOTE — Progress Notes (Signed)
Patient ID: Michael Miranda                 DOB: 19-May-1941                      MRN: [id number]     HPI: Michael Miranda is a 78 y.o. male patient of Dr. Burt Knack who presents today for hypertension evaluation. PMH significant for HTN, HLD, alcohol abuse 10 years ago. At last visit in HTN clinic 1 month ago, BP was elevated at 168/82 and lisinopril was increased to 40mg  daily.  Pt presents today in good spirits. Reports his BP readings have improved dramatically at home. Most readings 130/70s, some up to 140s/80s. Lowest reading was 122/69 and highest was 145/82. He checks BP few hours after taking pills in the morning using Omron bicep cuff which is about 52-6 years old. He denies dizziness, blurred vision, headache, or balance problems. He has started reading the sodium content in food labels. Does not use NSAIDs. Reports BP readings typically run higher in the doctor's office.  Current HTN meds:  Amlodipine 5mg  daily - PM Lisinopril 40mg  daily - AM  Previously tried: higher doses of amlodipine (tired and nauseated)  BP goal: <130/21mmHg  Family History: Cancer in his father; Coronary artery disease in his brother; Dementia in his mother; Heart attack in his brother  Social History: The patient  reports that he has never smoked. He has never used smokeless tobacco. He reports that he drinks alcohol. He reports that he does not use drugs.  Diet: He eats mostly from home. He tries to eat healthy food. He does eat vegetables regularly. He does not add salt to food. He does use some sauces. He drinks mostly water, 1 cup per day coffee. He drinks unsweet tea 2 glasses per day.   Exercise: He has not been exercising the last 2-3 weeks. He walks generally about 1 mile.   Home BP readings: Most readings 130/70s, some up to 140s/80s. Lowest reading was 122/69 and highest was 145/82.  Wt Readings from Last 3 Encounters:  12/12/18 138 lb (62.6 kg)  11/07/18 140 lb (63.5 kg)  10/24/18 138 lb (62.6  kg)   BP Readings from Last 3 Encounters:  12/12/18 (!) 165/75  12/11/18 (!) 168/82  11/13/18 (!) 158/80   Pulse Readings from Last 3 Encounters:  12/12/18 64  12/11/18 63  11/13/18 64    Renal function: CrCl cannot be calculated (Patient's most recent lab result is older than the maximum 21 days allowed.).  Past Medical History:  Diagnosis Date  . Alcohol abuse    10 years ago   . Arthritis   . Depression   . Hyperlipidemia   . Hypertension   . Kidney stones    20 years ago    Current Outpatient Medications on File Prior to Visit  Medication Sig Dispense Refill  . amLODipine (NORVASC) 5 MG tablet Take 1 tablet (5 mg total) by mouth daily. 90 tablet 3  . Coenzyme Q10 (CO Q-10 PO) Take by mouth.    Marland Kitchen lisinopril (PRINIVIL,ZESTRIL) 40 MG tablet Take 1 tablet (40 mg total) by mouth daily. 90 tablet 3  . rifampin (RIFADIN) 300 MG capsule Take 2 capsules (600 mg total) by mouth daily. 60 capsule 3  . simvastatin (ZOCOR) 10 MG tablet Take 1 tablet (10 mg total) by mouth at bedtime. 90 tablet 3  . zolpidem (AMBIEN) 10 MG tablet Take 1 tablet (10 mg total)  by mouth at bedtime as needed. for sleep 30 tablet 2   No current facility-administered medications on file prior to visit.     No Known Allergies  There were no vitals taken for this visit.   Assessment/Plan:  1. Hypertension: BP is much improved at home and in clinic and is now at goal <130/98mmHg. Will check BMET today with recent dose increase of lisinopril, and plan to continue lisinopril 40mg  AM and amlodipine 5mg  PM. Advised pt to continue to monitor his BP at home and call clinic if his readings increase above goal consistently. Encouraged him to increase activity and continue to monitor his sodium intake. Follow up in HTN clinic as needed.  Megan E. Supple, PharmD, BCACP, [facility] [address] [place], [place] [address] Phone: [phone]; Fax: [phone] 01/09/2019 11:02 AM

## 2019-01-09 NOTE — Patient Instructions (Addendum)
It was nice to see you today  Your blood pressure is looking much better and is now at goal < 130/65mmHg  Continue taking lisinopril 40mg  every morning and amlodipine 5mg  every evening  Continue to monitor your blood pressure at home and call Jinny Blossom, Pharmacist in clinic if your readings are consistently above goal [phone]

## 2019-02-18 ENCOUNTER — Other Ambulatory Visit: Payer: Self-pay | Admitting: Internal Medicine

## 2019-03-12 ENCOUNTER — Other Ambulatory Visit: Payer: Self-pay | Admitting: Adult Health

## 2019-03-12 DIAGNOSIS — G4700 Insomnia, unspecified: Secondary | ICD-10-CM

## 2019-03-13 MED ORDER — ZOLPIDEM TARTRATE 10 MG PO TABS: 10.0000 mg | ORAL_TABLET | Freq: Every evening | ORAL | 2 refills | Status: DC | PRN

## 2019-03-13 NOTE — Telephone Encounter (Signed)
Michael Miranda, spoke to the pt.  He does not have a smart phone.  Was seen once in 2019 for eye infection.  Please advise.

## 2019-03-13 NOTE — Telephone Encounter (Signed)
Please advise 

## 2019-07-24 ENCOUNTER — Telehealth: Payer: Self-pay | Admitting: Adult Health

## 2019-07-24 DIAGNOSIS — G4700 Insomnia, unspecified: Secondary | ICD-10-CM

## 2019-07-24 MED ORDER — ZOLPIDEM TARTRATE 10 MG PO TABS: 10.0000 mg | ORAL_TABLET | Freq: Every evening | ORAL | 0 refills | Status: DC | PRN

## 2019-07-24 NOTE — Telephone Encounter (Signed)
Caller name: Tye Maryland  Relation to pt: Call back number: [phone] fax # [phone]  Pharmacy: [facility] [phone] - [place], [place] [phone] (Phone) [phone] (Fax)     Reason for call:  As per pharmacist and insurance unable to fax PA request for zolpidem (AMBIEN) 10 MG tablet . Stating failed attempts to fax #  requesting an alternate fax # please advise

## 2019-07-26 NOTE — Telephone Encounter (Signed)
Pt calling because he is completely out of this medication and the pharmacy and insurance is telling him that prior authorization is needed.

## 2019-07-27 NOTE — Telephone Encounter (Signed)
PA submitted:  WILL BE NOTIFIED OF DETERMINATION BY FAX.  [id number]

## 2019-08-02 NOTE — Telephone Encounter (Signed)
Received a form requesting additional info.  Form filled out and faxed back.  Received authorization that the fax transmission was successful.

## 2019-08-07 NOTE — Telephone Encounter (Signed)
Received notification of approval.  Approval letter also states that a separate letter has been mailed to the pt.  Nothing further needed.

## 2019-08-07 NOTE — Telephone Encounter (Signed)
Dates of approval are 07/03/2019 through 08/01/2020.

## 2019-09-12 ENCOUNTER — Other Ambulatory Visit: Payer: Self-pay | Admitting: Cardiovascular Disease

## 2019-10-08 ENCOUNTER — Telehealth: Payer: Self-pay | Admitting: Adult Health

## 2019-10-08 DIAGNOSIS — G4700 Insomnia, unspecified: Secondary | ICD-10-CM

## 2019-10-08 NOTE — Telephone Encounter (Signed)
Medication Refill - Medication: zolpidem (AMBIEN) 10 MG tablet  Has the patient contacted their pharmacy? Yes.   (Agent: If no, request that the patient contact the pharmacy for the refill.) (Agent: If yes, when and what did the pharmacy advise?)  Preferred Pharmacy (with phone number or street name): [facility] [phone] - [place], [place] - [place]: Please be advised that RX refills may take up to 3 business days. We ask that you follow-up with your pharmacy.

## 2019-10-09 MED ORDER — ZOLPIDEM TARTRATE 10 MG PO TABS: 10.0000 mg | ORAL_TABLET | Freq: Every evening | ORAL | 1 refills | Status: DC | PRN

## 2019-10-24 ENCOUNTER — Other Ambulatory Visit: Payer: Self-pay

## 2019-10-25 ENCOUNTER — Encounter: Payer: Self-pay | Admitting: Adult Health

## 2019-10-25 ENCOUNTER — Ambulatory Visit (INDEPENDENT_AMBULATORY_CARE_PROVIDER_SITE_OTHER): Payer: Medicare Other | Admitting: Adult Health

## 2019-10-25 VITALS — BP 130/82 | Temp 97.6°F | Wt 129.0 lb

## 2019-10-25 DIAGNOSIS — I10 Essential (primary) hypertension: Secondary | ICD-10-CM

## 2019-10-25 DIAGNOSIS — E7800 Pure hypercholesterolemia, unspecified: Secondary | ICD-10-CM | POA: Diagnosis not present

## 2019-10-25 MED ORDER — ROSUVASTATIN CALCIUM 5 MG PO TABS: 5.0000 mg | ORAL_TABLET | Freq: Every day | ORAL | 3 refills | Status: DC

## 2019-10-25 NOTE — Progress Notes (Signed)
Subjective:    Patient ID: Michael Miranda, male    DOB: 1941-05-20, 78 y.o.   MRN: [id number]  HPI  78 year old male who  has a past medical history of Alcohol abuse, Arthritis, Depression, Hyperlipidemia, Hypertension, and Kidney stones.  He presents to the office today for follow up regarding hypertension. He is currently prescribed Norvasc 5 mg and Lisinopril 40. He is managed by cardiology but did not feel comfortable going to the cardiology office for BP management because " they have a lot of people in their waiting room". He reports that he sometimes have to split his lisinopril in half because his blood pressure gets down into the low 118's/80's. He is not symptomatic during these lows.   He was also doing some research on the Internet and saw that Simvastatin may interact with some blood pressure medications. He denies myalgias of fatigue   BP Readings from Last 3 Encounters:  10/25/19 130/82  01/09/19 130/72  12/12/18 (!) 165/75    Review of Systems See HPI   Past Medical History:  Diagnosis Date  . Alcohol abuse    10 years ago   . Arthritis   . Depression   . Hyperlipidemia   . Hypertension   . Kidney stones    20 years ago    Social History   Socioeconomic History  . Marital status: Married    Spouse name: Not on file  . Number of children: Not on file  . Years of education: Not on file  . Highest education level: Not on file  Occupational History  . Not on file  Social Needs  . Financial resource strain: Not on file  . Food insecurity    Worry: Not on file    Inability: Not on file  . Transportation needs    Medical: Not on file    Non-medical: Not on file  Tobacco Use  . Smoking status: Never Smoker  . Smokeless tobacco: Never Used  Substance and Sexual Activity  . Alcohol use: Yes    Alcohol/week: 0.0 standard drinks    Comment: Wine with dinner  . Drug use: No  . Sexual activity: Not on file  Lifestyle  . Physical activity    Days per  week: Not on file    Minutes per session: Not on file  . Stress: Not on file  Relationships  . Social Herbalist on phone: Not on file    Gets together: Not on file    Attends religious service: Not on file    Active member of club or organization: Not on file    Attends meetings of clubs or organizations: Not on file    Relationship status: Not on file  . Intimate partner violence    Fear of current or ex partner: Not on file    Emotionally abused: Not on file    Physically abused: Not on file    Forced sexual activity: Not on file  Other Topics Concern  . 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     Past Surgical History:  Procedure Laterality Date  . COCHLEAR IMPLANT     bilateral  . HERNIA REPAIR      Family History  Problem Relation Age of Onset  . Heart attack Brother   . Coronary artery disease Brother   . Cancer Father        lung  . Dementia Mother     No Known Allergies  Current Outpatient Medications on File Prior to Visit  Medication Sig Dispense Refill  . amLODipine (NORVASC) 5 MG tablet Take 1 tablet (5 mg total) by mouth daily. Please call and schedule an appointment with Dr Burt Knack for further refills 1st attempt 90 tablet 0  . Coenzyme Q10 (CO Q-10 PO) Take by mouth.    Marland Kitchen lisinopril (PRINIVIL,ZESTRIL) 40 MG tablet Take 1 tablet (40 mg total) by mouth daily. 90 tablet 3  . rifampin (RIFADIN) 300 MG capsule Take 2 capsules (600 mg total) by mouth daily. 60 capsule 3  . zolpidem (AMBIEN) 10 MG tablet Take 1 tablet (10 mg total) by mouth at bedtime as needed. for sleep 30 tablet 1   No current facility-administered medications on file prior to visit.     BP 130/82   Temp 97.6 F (36.4 C)   Wt 129 lb (58.5 kg)   BMI 19.61 kg/m       Objective:   Physical Exam Vitals  signs and nursing note reviewed.  Constitutional:      Appearance: Normal appearance.  Cardiovascular:     Rate and Rhythm: Normal rate and regular rhythm.     Pulses: Normal pulses.     Heart sounds: Normal heart sounds.  Pulmonary:     Effort: Pulmonary effort is normal.     Breath sounds: Normal breath sounds.  Musculoskeletal: Normal range of motion.  Skin:    General: Skin is warm and dry.     Capillary Refill: Capillary refill takes less than 2 seconds.  Neurological:     General: No focal deficit present.     Mental Status: He is alert and oriented to person, place, and time.  Psychiatric:        Mood and Affect: Mood normal.        Behavior: Behavior normal.        Thought Content: Thought content normal.        Judgment: Judgment normal.       Assessment & Plan:  1. Essential hypertension - BP well controlled.  - Advised no changes in medications   2. Hypercholesteremia - We reviewed his medications and there is an interaction between norvasc and simvastatin. Since he is on a low dose this interaction is low. He would feel more comfortable switching medications to a different statin - rosuvastatin (CRESTOR) 5 MG tablet; Take 1 tablet (5 mg total) by mouth daily.  Dispense: 90 tablet; Refill: 3  Dorothyann Peng, NP

## 2019-10-25 NOTE — Patient Instructions (Addendum)
It was great seeing you today   I have changed your simvastatin to crestor   Please follow up for your physical after the first of the year

## 2019-12-11 ENCOUNTER — Other Ambulatory Visit: Payer: Self-pay | Admitting: Cardiovascular Disease

## 2019-12-11 ENCOUNTER — Telehealth: Payer: Self-pay | Admitting: Cardiovascular Disease

## 2019-12-11 MED ORDER — LISINOPRIL 40 MG PO TABS: 20.0000 mg | ORAL_TABLET | Freq: Every day | ORAL | 3 refills | Status: DC

## 2019-12-11 MED ORDER — AMLODIPINE BESYLATE 5 MG PO TABS: 2.5000 mg | ORAL_TABLET | Freq: Every day | ORAL | 0 refills | Status: DC

## 2019-12-11 NOTE — Telephone Encounter (Signed)
The patient reports he started cutting his amlodipine and lisinopril in half several weeks ago due to fatigue and memory loss. He reports his feels much improved now. His blood pressure is consistently 135-140/70s and HR 60s. He would like to know if he should continue current regimen or change meds. If staying, will call meds in at lower dose to [facility] so he does not have to cut.  He was grateful for call and awaits med instructions.

## 2019-12-11 NOTE — Telephone Encounter (Signed)
Pt left a voicemail on Coumadin clinic line stating he changed his mind about medication changes and needed to clarify some information.  Returned call to pt - he states he prefers to keep cutting tablets in half instead of new prescriptions being sent in.  Pt states BP was [id number] systolic on full tablet of lisinopril and amlodipine. He started by cutting lisinopril in half and doesn't remember if he felt better then. He states he has felt better in the last few days - he cut both tablets in half just yesterday and today.   Pt does not wish to come to the office for follow up because of COVID. He does have a BP bicep cuff at home for the past 4-5 years. Advised him to monitor BP daily for the next 2 weeks on 1/2 tab of lisinopril and 1/2 tab of amlodipine. I will call him to discuss readings at that time. Advised him we want to target BP of 130/80 as long as he doesn't feel poorly with BP at that target. Updated med list but did not send in new rx per pt request - can adjust med orders if needed at 2 week phone call follow up.

## 2019-12-11 NOTE — Telephone Encounter (Signed)
New Message     Pt c/o medication issue:  1. Name of Medication: lisinopril (PRINIVIL,ZESTRIL) 40 MG tablet amLODipine (NORVASC) 5 MG tablet  2. How are you currently taking this medication (dosage and times per day)? Pt cuts the Lisinopril in half and sometimes cuts the amlodipine in half   3. Are you having a reaction (difficulty breathing--STAT)? No   4. What is your medication issue? Pt says he feels slow or tired and cuts the medications in half. He is wondering if he should be on something else    Please call

## 2019-12-11 NOTE — Telephone Encounter (Signed)
Did pt monitor BP when he was still on full dose of amlodipine and lisinopril? Would be ok to continue both meds at lower dose - could also try increasing lisinopril back to 40mg  daily and keeping 1/2 dose of amlodipine and have him monitor BP. If BP are at goal <130/11mmHg at that time, could try discontinuing amlodipine altogether to help consolidate his medications.

## 2019-12-25 ENCOUNTER — Telehealth: Payer: Self-pay | Admitting: Pharmacist

## 2019-12-25 MED ORDER — LISINOPRIL 20 MG PO TABS: 20.0000 mg | ORAL_TABLET | Freq: Every day | ORAL | 3 refills | Status: DC

## 2019-12-25 MED ORDER — AMLODIPINE BESYLATE 2.5 MG PO TABS: 2.5000 mg | ORAL_TABLET | Freq: Every day | ORAL | 3 refills | Status: DC

## 2019-12-25 NOTE — Telephone Encounter (Signed)
Called pt to follow up with BP, readings over the past week as follows:  1/14 - 132/72, 105/63, HR 62 1/15 - 134/76, HR 60 1/16 - 130/70, 97/58, HR 63 1/17 - 142/75, 130/74, HR 62 1/18 - 131/72, 112/63, HR 61 1/19 - 132/72, HR 64  Takes both amlodipine and lisinopril between 3pm-5pm. He feels about the same since cutting med doses back. Denies dizziness or balance problems. 2nd reading of the day checked around 3-5pm.  Advised pt to continue on current meds, will send in refills with lower dosage strengths. Pt and wife confirmed understanding of plan. He will also move 1 BP medication to the AM to space apart dosing to help provide more consistency with BP readings since afternoon readings have been dropping a bit low a few hours after taking both BP meds at the same time.

## 2020-01-25 ENCOUNTER — Encounter: Payer: Medicare Other | Admitting: Adult Health

## 2020-03-18 ENCOUNTER — Telehealth: Payer: Self-pay | Admitting: Adult Health

## 2020-03-18 DIAGNOSIS — G4700 Insomnia, unspecified: Secondary | ICD-10-CM

## 2020-03-18 MED ORDER — ZOLPIDEM TARTRATE 10 MG PO TABS: 10.0000 mg | ORAL_TABLET | Freq: Every evening | ORAL | 1 refills | Status: DC | PRN

## 2020-03-18 NOTE — Telephone Encounter (Signed)
Medication Refill:Zolpidem  Pharmacy: [facility]: [phone]

## 2020-05-07 ENCOUNTER — Ambulatory Visit (INDEPENDENT_AMBULATORY_CARE_PROVIDER_SITE_OTHER): Payer: Medicare Other | Admitting: Adult Health

## 2020-05-07 ENCOUNTER — Other Ambulatory Visit: Payer: Self-pay

## 2020-05-07 ENCOUNTER — Encounter: Payer: Self-pay | Admitting: Adult Health

## 2020-05-07 VITALS — BP 168/80 | Temp 97.5°F | Ht 68.0 in | Wt 131.0 lb

## 2020-05-07 DIAGNOSIS — I10 Essential (primary) hypertension: Secondary | ICD-10-CM | POA: Diagnosis not present

## 2020-05-07 DIAGNOSIS — G4700 Insomnia, unspecified: Secondary | ICD-10-CM

## 2020-05-07 DIAGNOSIS — E7800 Pure hypercholesterolemia, unspecified: Secondary | ICD-10-CM

## 2020-05-07 LAB — CBC WITH DIFFERENTIAL/PLATELET
Basophils Absolute: 0 10*3/uL (ref 0.0–0.1)
Basophils Relative: 1 % (ref 0.0–3.0)
Eosinophils Absolute: 0.1 10*3/uL (ref 0.0–0.7)
Eosinophils Relative: 3 % (ref 0.0–5.0)
HCT: 43.9 % (ref 39.0–52.0)
Hemoglobin: 15.2 g/dL (ref 13.0–17.0)
Lymphocytes Relative: 18.2 % (ref 12.0–46.0)
Lymphs Abs: 0.7 10*3/uL (ref 0.7–4.0)
MCHC: 34.6 g/dL (ref 30.0–36.0)
MCV: 98.9 fl (ref 78.0–100.0)
Monocytes Absolute: 0.5 10*3/uL (ref 0.1–1.0)
Monocytes Relative: 13.5 % — ABNORMAL HIGH (ref 3.0–12.0)
Neutro Abs: 2.5 10*3/uL (ref 1.4–7.7)
Neutrophils Relative %: 64.3 % (ref 43.0–77.0)
Platelets: 158 10*3/uL (ref 150.0–400.0)
RBC: 4.44 Mil/uL (ref 4.22–5.81)
RDW: 13.1 % (ref 11.5–15.5)
WBC: 3.9 10*3/uL — ABNORMAL LOW (ref 4.0–10.5)

## 2020-05-07 LAB — COMPREHENSIVE METABOLIC PANEL
ALT: 15 U/L (ref 0–53)
AST: 22 U/L (ref 0–37)
Albumin: 4.5 g/dL (ref 3.5–5.2)
Alkaline Phosphatase: 77 U/L (ref 39–117)
BUN: 16 mg/dL (ref 6–23)
CO2: 31 mEq/L (ref 19–32)
Calcium: 9.1 mg/dL (ref 8.4–10.5)
Chloride: 102 mEq/L (ref 96–112)
Creatinine, Ser: 0.97 mg/dL (ref 0.40–1.50)
GFR: 74.74 mL/min (ref >60.00)
Glucose, Bld: 86 mg/dL (ref 70–99)
Potassium: 4 mEq/L (ref 3.5–5.1)
Sodium: 138 mEq/L (ref 135–145)
Total Bilirubin: 0.6 mg/dL (ref 0.2–1.2)
Total Protein: 6.6 g/dL (ref 6.0–8.3)

## 2020-05-07 LAB — LIPID PANEL
Cholesterol: 189 mg/dL (ref 0–200)
HDL: 84.6 mg/dL (ref >39.00)
LDL Cholesterol: 90 mg/dL (ref 0–99)
NonHDL: 104.61
Total CHOL/HDL Ratio: 2
Triglycerides: 73 mg/dL (ref 0.0–149.0)
VLDL: 14.6 mg/dL (ref 0.0–40.0)

## 2020-05-07 LAB — TSH: TSH: 0.95 u[IU]/mL (ref 0.35–4.50)

## 2020-05-07 NOTE — Patient Instructions (Signed)
It was great seeing you today   Your blood pressure was elevated today 160/80. Please continue to monitor your blood pressure at home and if your blood pressure is above 140/80 then restart lisinopril   We will follow up with you regarding your blood work

## 2020-05-07 NOTE — Progress Notes (Signed)
Subjective:    Patient ID: Michael Miranda, male    DOB: Dec 06, 1941, 79 y.o.   MRN: [id number]  HPI Patient presents for yearly preventative medicine examination. He is a pleasant 79 year old male who  has a past medical history of Alcohol abuse, Arthritis, Depression, Hyperlipidemia, Hypertension, and Kidney stones.  Essential Hypertension -managed by cardiology and currently prescribed Norvasc 2.5 mg and lisinopril 20 mg daily.  He stopped taking lisinopril about 3 weeks ago because it was causing him to feel " foggy headed". He has been monitoring his blood pressure at home and reports readings consistently in the 130's/70's. He has a follow up with Cardiology in August BP Readings from Last 3 Encounters:  05/07/20 (!) 168/80  10/25/19 130/82  01/09/19 130/72   Hyperlipidemia - takes Crestor 5 mg daily. He denies myalgia or fatigue.  Lab Results  Component Value Date   CHOL 180 11/22/2017   HDL 90.20 11/22/2017   LDLCALC 73 11/22/2017   LDLDIRECT 92.3 10/01/2013   TRIG 83.0 11/22/2017   CHOLHDL 2 11/22/2017   Insomnia - takes Ambien 10 mg QHS  All immunizations and health maintenance protocols were reviewed with the patient and needed orders were placed. He is up to date on vaccinations    Appropriate screening laboratory values were ordered for the patient including screening of hyperlipidemia, renal function and hepatic function.  Medication reconciliation,  past medical history, social history, problem list and allergies were reviewed in detail with the patient  Goals were established with regard to weight loss, exercise, and  diet in compliance with medications. He is walking and staying active and tries to eat healthy.   He has no acute complaints.    Review of Systems  Constitutional: Negative.   HENT: Negative.   Eyes: Negative.   Respiratory: Negative.   Cardiovascular: Negative.   Gastrointestinal: Negative.   Endocrine: Negative.   Genitourinary: Negative.    Musculoskeletal: Negative.   Skin: Negative.   Allergic/Immunologic: Negative.   Neurological: Negative.   Hematological: Negative.   Psychiatric/Behavioral: Negative.   All other systems reviewed and are negative.  Past Medical History:  Diagnosis Date   Alcohol abuse    10 years ago    Arthritis    Depression    Hyperlipidemia    Hypertension    Kidney stones    20 years ago    Social History   Socioeconomic History   Marital status: Married    Spouse name: Not on file   Number of children: Not on file   Years of education: Not on file   Highest education level: Not on file  Occupational History   Not on file  Tobacco Use   Smoking status: Never Smoker   Smokeless tobacco: Never Used  Substance and Sexual Activity   Alcohol use: Yes    Alcohol/week: 0.0 standard drinks    Comment: Wine with dinner   Drug use: No   Sexual activity: Not on file  Other Topics Concern   Not on file  Social History Narrative   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    Social Determinants of Health   Financial Resource Strain:    Difficulty of Paying Living Expenses:   Food Insecurity:    Worried About Charity fundraiser  in the Last Year:    Arboriculturist in the Last Year:   Transportation Needs:    Film/video editor (Medical):    Lack of Transportation (Non-Medical):   Physical Activity:    Days of Exercise per Week:    Minutes of Exercise per Session:   Stress:    Feeling of Stress :   Social Connections:    Frequency of Communication with Friends and Family:    Frequency of Social Gatherings with Friends and Family:    Attends Religious Services:    Active Member of Clubs or Organizations:    Attends Music therapist:    Marital Status:   Intimate Partner Violence:    Fear of  Current or Ex-Partner:    Emotionally Abused:    Physically Abused:    Sexually Abused:     Past Surgical History:  Procedure Laterality Date   COCHLEAR IMPLANT     bilateral   HERNIA REPAIR      Family History  Problem Relation Age of Onset   Heart attack Brother    Coronary artery disease Brother    Cancer Father        lung   Dementia Mother     No Known Allergies  Current Outpatient Medications on File Prior to Visit  Medication Sig Dispense Refill   amLODipine (NORVASC) 2.5 MG tablet Take 1 tablet (2.5 mg total) by mouth daily. 90 tablet 3   Coenzyme Q10 (CO Q-10 PO) Take by mouth.     lisinopril (ZESTRIL) 20 MG tablet Take 1 tablet (20 mg total) by mouth daily. 90 tablet 3   rifampin (RIFADIN) 300 MG capsule Take 2 capsules (600 mg total) by mouth daily. 60 capsule 3   rosuvastatin (CRESTOR) 5 MG tablet Take 1 tablet (5 mg total) by mouth daily. 90 tablet 3   zolpidem (AMBIEN) 10 MG tablet Take 1 tablet (10 mg total) by mouth at bedtime as needed. for sleep 30 tablet 1   No current facility-administered medications on file prior to visit.    BP (!) 168/80    Temp (!) 97.5 F (36.4 C)    Ht 5\' 8"  (1.727 m)    Wt 131 lb (59.4 kg)    BMI 19.92 kg/m       Objective:   Physical Exam Vitals and nursing note reviewed.  Constitutional:      General: He is not in acute distress.    Appearance: Normal appearance. He is well-developed and normal weight.  HENT:     Head: Normocephalic and atraumatic.     Comments: Cochlear implant     Right Ear: Tympanic membrane, ear canal and external ear normal. There is no impacted cerumen.     Left Ear: Tympanic membrane, ear canal and external ear normal. There is no impacted cerumen.     Nose: Nose normal. No congestion or rhinorrhea.     Mouth/Throat:     Mouth: Mucous membranes are moist.     Pharynx: Oropharynx is clear. No oropharyngeal exudate or posterior oropharyngeal erythema.  Eyes:     General:         Right eye: No discharge.        Left eye: No discharge.     Extraocular Movements: Extraocular movements intact.     Conjunctiva/sclera: Conjunctivae normal.     Pupils: Pupils are equal, round, and reactive to light.  Neck:     Vascular: No carotid bruit.  Trachea: No tracheal deviation.  Cardiovascular:     Rate and Rhythm: Normal rate and regular rhythm.     Pulses: Normal pulses.     Heart sounds: Normal heart sounds. No murmur. No friction rub. No gallop.   Pulmonary:     Effort: Pulmonary effort is normal. No respiratory distress.     Breath sounds: Normal breath sounds. No stridor. No wheezing, rhonchi or rales.  Chest:     Chest wall: No tenderness.  Abdominal:     General: Bowel sounds are normal. There is no distension.     Palpations: Abdomen is soft. There is no mass.     Tenderness: There is no abdominal tenderness. There is no right CVA tenderness, left CVA tenderness, guarding or rebound.     Hernia: No hernia is present.  Musculoskeletal:        General: No swelling, tenderness, deformity or signs of injury. Normal range of motion.     Right lower leg: No edema.     Left lower leg: No edema.  Lymphadenopathy:     Cervical: No cervical adenopathy.  Skin:    General: Skin is warm and dry.     Capillary Refill: Capillary refill takes less than 2 seconds.     Coloration: Skin is not jaundiced or pale.     Findings: No bruising, erythema, lesion or rash.  Neurological:     General: No focal deficit present.     Mental Status: He is alert and oriented to person, place, and time.     Cranial Nerves: No cranial nerve deficit.     Sensory: No sensory deficit.     Motor: No weakness.     Coordination: Coordination normal.     Gait: Gait normal.     Deep Tendon Reflexes: Reflexes normal.  Psychiatric:        Mood and Affect: Mood normal.        Behavior: Behavior normal.        Thought Content: Thought content normal.        Judgment: Judgment normal.         Assessment & Plan:  1. Essential hypertension - Elevated BP in the office today but reports better controlled at home  - if BP elevated then restart lisinopril  - CBC with Differential/Platelet - Comprehensive metabolic panel - Lipid panel - TSH  2. Hypercholesteremia - Consider dose change of crestor  - CBC with Differential/Platelet - Comprehensive metabolic panel - Lipid panel - TSH  3. Insomnia, unspecified type - Continue ambien as needed - CBC with Differential/Platelet - Comprehensive metabolic panel - Lipid panel - TSH   Dorothyann Peng, NP

## 2020-07-18 ENCOUNTER — Encounter: Payer: Self-pay | Admitting: Family Medicine

## 2020-07-18 ENCOUNTER — Ambulatory Visit (INDEPENDENT_AMBULATORY_CARE_PROVIDER_SITE_OTHER): Payer: Medicare Other | Admitting: Family Medicine

## 2020-07-18 ENCOUNTER — Other Ambulatory Visit: Payer: Self-pay

## 2020-07-18 VITALS — BP 140/70 | HR 63 | Temp 98.9°F | Wt 128.6 lb

## 2020-07-18 DIAGNOSIS — S60221A Contusion of right hand, initial encounter: Secondary | ICD-10-CM | POA: Diagnosis not present

## 2020-07-18 NOTE — Progress Notes (Signed)
   Subjective:    Patient ID: Michael Miranda, male    DOB: 11-06-1941, 79 y.o.   MRN: [id number]  HPI Here to check his right hand. Two days ago at home he slammed his hand palm down on a counter top and he quickly developed swelling in the 3rd finger. This is mildly painful but not too bad. He has been applying ice off and on.    Review of Systems  Constitutional: Negative.   Respiratory: Negative.   Cardiovascular: Negative.   Musculoskeletal: Positive for joint swelling.       Objective:   Physical Exam Constitutional:      General: He is not in acute distress.    Appearance: Normal appearance.  Cardiovascular:     Rate and Rhythm: Normal rate and regular rhythm.     Pulses: Normal pulses.     Heart sounds: Normal heart sounds.  Pulmonary:     Effort: Pulmonary effort is normal.     Breath sounds: Normal breath sounds.  Musculoskeletal:     Comments: The left 3rd finger is mildly swollen and ecchymotic. The PIP joint is mildly tender but ROM is preserved   Neurological:     Mental Status: He is alert.           Assessment & Plan:  Contusion to the right hand. I reassured him this looks worse than it really is. He can continue ice. The swelling should go away over the next 1-2 weeks. Recheck as needed.  Alysia Penna, MD

## 2020-08-01 ENCOUNTER — Other Ambulatory Visit: Payer: Self-pay | Admitting: Adult Health

## 2020-08-01 DIAGNOSIS — G4700 Insomnia, unspecified: Secondary | ICD-10-CM

## 2020-08-01 NOTE — Telephone Encounter (Signed)
Okay for refill?    LOV 05/07/2020   Last Refill   03/2020   30     QTY.   1 Refills

## 2020-08-04 ENCOUNTER — Ambulatory Visit: Payer: Medicare Other | Admitting: Cardiovascular Disease

## 2020-09-29 ENCOUNTER — Other Ambulatory Visit: Payer: Self-pay

## 2020-09-29 ENCOUNTER — Ambulatory Visit (INDEPENDENT_AMBULATORY_CARE_PROVIDER_SITE_OTHER): Payer: Medicare Other | Admitting: Cardiovascular Disease

## 2020-09-29 ENCOUNTER — Encounter: Payer: Self-pay | Admitting: Cardiovascular Disease

## 2020-09-29 VITALS — BP 172/84 | HR 64 | Ht 69.0 in | Wt 130.6 lb

## 2020-09-29 DIAGNOSIS — R0789 Other chest pain: Secondary | ICD-10-CM

## 2020-09-29 DIAGNOSIS — E782 Mixed hyperlipidemia: Secondary | ICD-10-CM | POA: Diagnosis not present

## 2020-09-29 DIAGNOSIS — I10 Essential (primary) hypertension: Secondary | ICD-10-CM

## 2020-09-29 MED ORDER — LOSARTAN POTASSIUM 50 MG PO TABS: 50.0000 mg | ORAL_TABLET | Freq: Every day | ORAL | 3 refills | Status: DC

## 2020-09-29 NOTE — Patient Instructions (Signed)
Medication Instructions:  1) STOP AMLODIPINE 2) START LOSARTAN 50 mg daily *If you need a refill on your cardiac medications before your next appointment, please call your pharmacy*  Lab Work: You will have lab work when you return for your HTN CLINIC appointment.  If you have labs (blood work) drawn today and your tests are completely normal, you will receive your results only by: Marland Kitchen MyChart Message (if you have MyChart) OR . A paper copy in the mail If you have any lab test that is abnormal or we need to change your treatment, we will call you to review the results.  Follow-Up: At [facility], you and your health needs are our priority.  As part of our continuing mission to provide you with exceptional heart care, we have created designated Provider Care Teams.  These Care Teams include your primary Cardiologist (physician) and Advanced Practice Providers (APPs -  Physician Assistants and Nurse Practitioners) who all work together to provide you with the care you need, when you need it. Your next appointment:   12 month(s) The format for your next appointment:   In Person Provider:   You may see Sherren Mocha, MD or one of the following Advanced Practice Providers on your designated Care Team:    Richardson Dopp, PA-C  Vin [place], [place]

## 2020-09-29 NOTE — Progress Notes (Signed)
Cardiology Office Note:    Date:  09/29/2020   ID:  Michael Miranda, DOB May 15, 1941, MRN [id number]  PCP:  Dorothyann Peng, NP  [facility] Cardiologist:  Sherren Mocha, MD  [place] Electrophysiologist:  None   Referring MD: Dorothyann Peng, NP   Chief Complaint  Patient presents with  . Hypertension    History of Present Illness:    Michael Miranda is a 79 y.o. male with a hx of hypertension, presenting for follow-up evaluation.  The patient was initially evaluated in 2014 for chest pain.  He underwent a stress Myoview study demonstrating inferior wall thinning with normal left ventricular function and no ischemia.  He has been on medical therapy since that time.  His antihypertensive medicines were adjusted back in 2019, when I last saw him.  He was followed in the hypertension clinic thereafter.  He continues to follow with his primary care physician.  His last stress test in 2018 showed stable findings with a small, mild defect in the basal inferior location consistent with artifact.  The study was felt to be low risk.  The gated LVEF was 64%.  The patient is here alone today. States that he hasn't done well lately. He reports some chest pressure intermittently. This is not clearly associated with physical exertion. The pressure is non-radiating. He denies shortness of breath. He feels like he has a 'brain fog' and associates amlodipine with this. He feels 'depressed.'  No heart palpitations, edema, orthopnea, or PND.  Past Medical History:  Diagnosis Date  . Alcohol abuse    10 years ago   . Arthritis   . Depression   . Hyperlipidemia   . Hypertension   . Kidney stones    20 years ago    Past Surgical History:  Procedure Laterality Date  . COCHLEAR IMPLANT     bilateral  . HERNIA REPAIR      Current Medications: Current Meds  Medication Sig  . rosuvastatin (CRESTOR) 5 MG tablet Take 1 tablet (5 mg total) by mouth daily.  Marland Kitchen zolpidem (AMBIEN) 10 MG tablet  TAKE 1 TABLET(10 MG) BY MOUTH AT BEDTIME AS NEEDED FOR SLEEP.  . [DISCONTINUED] amLODipine (NORVASC) 2.5 MG tablet Take 1 tablet (2.5 mg total) by mouth daily.     Allergies:   Patient has no known allergies.   Social History   Socioeconomic History  . Marital status: Married    Spouse name: Not on file  . Number of children: Not on file  . Years of education: Not on file  . Highest education level: Not on file  Occupational History  . Not on file  Tobacco Use  . Smoking status: Never Smoker  . Smokeless tobacco: Never Used  Substance and Sexual Activity  . Alcohol use: Yes    Alcohol/week: 0.0 standard drinks    Comment: Wine with dinner  . Drug use: No  . Sexual activity: Not on file  Other Topics Concern  . 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    Social Determinants of Health   Financial Resource Strain:   . Difficulty of Paying Living Expenses: Not on file  Food Insecurity:   . Worried About Charity fundraiser in the Last Year: Not on  file  . [place] in the Last Year: Not on file  Transportation Needs:   . Lack of Transportation (Medical): Not on file  . Lack of Transportation (Non-Medical): Not on file  Physical Activity:   . Days of Exercise per Week: Not on file  . Minutes of Exercise per Session: Not on file  Stress:   . Feeling of Stress : Not on file  Social Connections:   . Frequency of Communication with Friends and Family: Not on file  . Frequency of Social Gatherings with Friends and Family: Not on file  . Attends Religious Services: Not on file  . Active Member of Clubs or Organizations: Not on file  . Attends Archivist Meetings: Not on file  . Marital Status: Not on file     Family History: The patient's family history includes Cancer in his father;  Coronary artery disease in his brother; Dementia in his mother; Heart attack in his brother.  ROS:   Please see the history of present illness.    All other systems reviewed and are negative.  EKGs/Labs/Other Studies Reviewed:    The following studies were reviewed today: Myoview stress test 02/14/2017: Study Highlights    Nuclear stress EF: 64%. No wall motion abnormalities  There was no ST segment deviation noted during stress.  Defect 1: There is a small defect of mild severity present in the basal inferior location. This likely represents diaphragmatic attenuation artifact  This is a low risk study. No ischemia identified   EKG:  EKG is ordered today.  The ekg ordered today demonstrates NSR 60 bpm, normal tracing  Recent Labs: 05/07/2020: ALT 15; BUN 16; Creatinine, Ser 0.97; Hemoglobin 15.2; Platelets 158.0; Potassium 4.0; Sodium 138; TSH 0.95  Recent Lipid Panel    Component Value Date/Time   CHOL 189 05/07/2020 0759   TRIG 73.0 05/07/2020 0759   HDL 84.60 05/07/2020 0759   CHOLHDL 2 05/07/2020 0759   VLDL 14.6 05/07/2020 0759   LDLCALC 90 05/07/2020 0759   LDLDIRECT 92.3 10/01/2013 0916     Risk Assessment/Calculations:       Physical Exam:    VS:  BP (!) 172/84   Pulse 64   Ht 5\' 9"  (1.753 m)   Wt 130 lb 9.6 oz (59.2 kg)   SpO2 98%   BMI 19.29 kg/m     Wt Readings from Last 3 Encounters:  09/29/20 130 lb 9.6 oz (59.2 kg)  07/18/20 128 lb 9.6 oz (58.3 kg)  05/07/20 131 lb (59.4 kg)     GEN: Thin male in no acute distress HEENT: Normal NECK: No JVD; No carotid bruits LYMPHATICS: No lymphadenopathy CARDIAC: RRR, no murmurs, rubs, gallops RESPIRATORY:  Clear to auscultation without rales, wheezing or rhonchi  ABDOMEN: Soft, non-tender, non-distended MUSCULOSKELETAL:  No edema; No deformity  SKIN: Warm and dry NEUROLOGIC:  Alert and oriented x 3 PSYCHIATRIC:  Normal affect   ASSESSMENT:    1. Essential hypertension   2. Mixed hyperlipidemia    3. Chest pressure    PLAN:    In order of problems listed above:  1. The patient is concerned about side effects of amlodipine.  I went back and reviewed his record and he has been on this now for several years.  He feels like it is contributing to a "brain fog."  He has previously tolerated an ACE/thiazide diuretic combination pill.  He certainly has a significant component of whitecoat hypertension.  His home readings have  ranged from 134/74 to a high of 158/88 mmHg.  I have recommended that he change to losartan 50 mg daily starting tomorrow.  He will stop amlodipine.  We will bring him back in a few weeks for a hypertension clinic visit and a follow-up metabolic panel.  I asked him to bring readings in with him, but to avoid checking his blood pressure more than once per day. 2. Lipids are controlled in this patient with no atherosclerotic coronary disease.  Cholesterol is 189, HDL 85, LDL 80, triglycerides 73 mg/dL.  Considering his concern about side effects of medications over the years, I would recommend continuing on Crestor 5 mg daily. 3. No exertional component.  Atypical symptoms, suspect stress related.  Last stress test from 2018 reviewed.  Continue medical management as outlined above.  Medication Adjustments/Labs and Tests Ordered: Current medicines are reviewed at length with the patient today.  Concerns regarding medicines are outlined above.  Orders Placed This Encounter  Procedures  . Basic metabolic panel  . AMB Referral to [facility] Pharm-D  . EKG 12-Lead   Meds ordered this encounter  Medications  . losartan (COZAAR) 50 MG tablet    Sig: Take 1 tablet (50 mg total) by mouth daily.    Dispense:  90 tablet    Refill:  3    Patient Instructions  Medication Instructions:  1) STOP AMLODIPINE 2) START LOSARTAN 50 mg daily *If you need a refill on your cardiac medications before your next appointment, please call your pharmacy*  Lab Work: You will have lab work when  you return for your HTN CLINIC appointment.  If you have labs (blood work) drawn today and your tests are completely normal, you will receive your results only by: Marland Kitchen MyChart Message (if you have MyChart) OR . A paper copy in the mail If you have any lab test that is abnormal or we need to change your treatment, we will call you to review the results.  Follow-Up: At [facility], you and your health needs are our priority.  As part of our continuing mission to provide you with exceptional heart care, we have created designated Provider Care Teams.  These Care Teams include your primary Cardiologist (physician) and Advanced Practice Providers (APPs -  Physician Assistants and Nurse Practitioners) who all work together to provide you with the care you need, when you need it. Your next appointment:   12 month(s) The format for your next appointment:   In Person Provider:   You may see Sherren Mocha, MD or one of the following Advanced Practice Providers on your designated Care Team:    Richardson Dopp, PA-C  Robbie Lis, [place]      Signed, Sherren Mocha, MD  09/29/2020 1:28 PM    [place]

## 2020-10-13 NOTE — Progress Notes (Signed)
Patient ID: Michael Miranda                 DOB: July 12, 1941                      MRN: [id number]     HPI: Michael Miranda is a 79 y.o. male referred by Dr. Burt Miranda to HTN clinic. PMH is significant for HTN, HLD, depression, hx of alcohol abuse.  At last visit on 09/29/20 with Dr. Burt Miranda, BP was elevated at 172/84 and reported some intermittent non-radiating chest pressure that was not clearly associated with physical exertion. Also, reported some "brain fog" with amlodipine. Home readings ranged from 134/74 to a high of 158/88 mmHg and often experiences white-coat hypertension. Amlodipine was discontinued and losartan 50 mg daily was started.  Today, patient presents in good spirits. Reports medication adherence and no side effects with losartan in the mornings. Denies headaches, dizziness, blurred vision, and swelling. Reports home BP readings average 140s/70-80s with highs of 152/80 and 168/88. Additionally, reports increased stress due to cochlear implant malfunctioning causing hearing to fade in and out - plans to see provider early next year.   Current HTN meds: Losartan 50 mg daily (AM) Previously tried: amlodipine 2.5, 5 mg daily (brain fog), lisinopril 20 mg daily ("foggy headed") BP goal: <130/80 mmHg  Family History: heart attack (brother), coronary artery disease (brother), dementia (mother), lung cancer (father)  Social History: denies tobacco abuse; hx of alcohol abuse  Diet:  Breakfast - All brands cereal Lunch - tuna fish sandwich, vegetable soup, scrambled egg Dinner - chicken breast with vegetables, spaghettti Limits fried food, red meat every other 3rd day Drink - 1 cup of coffee, hot tea, glass a wine/day  Exercise: walk 25 mins (5-10 blocks)  Home BP readings: 10/26 - 11/8 BP: 142/79, 140/77, 139/76, 137/85, 131/70, 134/82, 152/80, 137/75, 147/79, 142/78, 140/80, 168/88, 140/80 HR: 60s  Wt Readings from Last 3 Encounters:  09/29/20 130 lb 9.6 oz (59.2 kg)    07/18/20 128 lb 9.6 oz (58.3 kg)  05/07/20 131 lb (59.4 kg)   BP Readings from Last 3 Encounters:  10/14/20 (!) 186/86  09/29/20 (!) 172/84  07/18/20 140/70   Pulse Readings from Last 3 Encounters:  10/14/20 63  09/29/20 64  07/18/20 63    Renal function: CrCl cannot be calculated (Patient's most recent lab result is older than the maximum 21 days allowed.).  Past Medical History:  Diagnosis Date  . Alcohol abuse    10 years ago   . Arthritis   . Depression   . Hyperlipidemia   . Hypertension   . Kidney stones    20 years ago    Current Outpatient Medications on File Prior to Visit  Medication Sig Dispense Refill  . Coenzyme Q10 (CO Q-10 PO) Take by mouth.    . losartan (COZAAR) 50 MG tablet Take 1 tablet (50 mg total) by mouth daily. 90 tablet 3  . rosuvastatin (CRESTOR) 5 MG tablet Take 1 tablet (5 mg total) by mouth daily. 90 tablet 3  . zolpidem (AMBIEN) 10 MG tablet TAKE 1 TABLET(10 MG) BY MOUTH AT BEDTIME AS NEEDED FOR SLEEP. 30 tablet 2   No current facility-administered medications on file prior to visit.   No Known Allergies  Assessment/Plan:  1. Hypertension - BP above goal <130/80 mmHg and home readings remain elevated. Patient with some level of whitecoat hypertension as clinic BP was 186/86, however home readings average 140s/80s. Medication  adherence appears optimal. Will plan to increase losartan to 100 mg daily pending BMET results drawn today. Discussed checking BP at least 1 hour after medications and to bring log and BP monitor to next visit. Discussed healthy diets consisting of vegetables, fruit and lean meats (chicken, [place], fish) and to limit salt intake. Encouraged patient to continue exercising 20-30 mins daily with the goal of 150 minutes per week. Patient verbalized understanding. Follow-up visit scheduled in 3 weeks with labs.   Lorel Monaco, PharmD PGY2 [place] [address],  [place], [place] [address] Phone: [phone]; Fax: [phone]

## 2020-10-14 ENCOUNTER — Ambulatory Visit (INDEPENDENT_AMBULATORY_CARE_PROVIDER_SITE_OTHER): Payer: Medicare Other | Admitting: Pharmacist

## 2020-10-14 ENCOUNTER — Encounter: Payer: Self-pay | Admitting: Pharmacist

## 2020-10-14 ENCOUNTER — Other Ambulatory Visit: Payer: Self-pay

## 2020-10-14 ENCOUNTER — Other Ambulatory Visit: Payer: Medicare Other

## 2020-10-14 VITALS — BP 186/86 | HR 63

## 2020-10-14 DIAGNOSIS — E782 Mixed hyperlipidemia: Secondary | ICD-10-CM

## 2020-10-14 DIAGNOSIS — I10 Essential (primary) hypertension: Secondary | ICD-10-CM | POA: Diagnosis not present

## 2020-10-14 LAB — BASIC METABOLIC PANEL
BUN/Creatinine Ratio: 12 (ref 10–24)
BUN: 12 mg/dL (ref 8–27)
CO2: 26 mmol/L (ref 20–29)
Calcium: 9.4 mg/dL (ref 8.6–10.2)
Chloride: 102 mmol/L (ref 96–106)
Creatinine, Ser: 1.03 mg/dL (ref 0.76–1.27)
GFR calc Af Amer: 79 mL/min/{1.73_m2} (ref >59)
GFR calc non Af Amer: 69 mL/min/{1.73_m2} (ref >59)
Glucose: 94 mg/dL (ref 65–99)
Potassium: 4.5 mmol/L (ref 3.5–5.2)
Sodium: 139 mmol/L (ref 134–144)

## 2020-10-14 NOTE — Patient Instructions (Addendum)
It was nice to see you today!  Your goal blood pressure is <130/80 mmHg. In clinic, your blood pressure was 186/86 mmHg.  Medication Changes: We will check labs today. I will give you a call with the results and if everything is normal, we will increase Losartan to 100 mg daily.   You will take 2 tablets of Losartan 50 mg daily and once that bottle is finished, you can pick up your new prescription from the pharmacy for Losartan 100 mg daily - 1 tablet once daily.  Bring BP monitor to your next visit too  Monitor blood pressure at home daily and keep a log (on your phone or piece of paper) to bring with you to your next visit. Write down date, time, blood pressure and pulse.  Keep up the good work with diet and exercise. Aim for a diet full of vegetables, fruit and lean meats (chicken, [place], fish). Try to limit salt intake by eating fresh or frozen vegetables (instead of canned), rinse canned vegetables prior to cooking and do not add any additional salt to meals.   Please give [place] a call at [phone] with any questions or concerns.  See you in three weeks!

## 2020-10-15 ENCOUNTER — Telehealth: Payer: Self-pay | Admitting: Pharmacist

## 2020-10-15 DIAGNOSIS — I10 Essential (primary) hypertension: Secondary | ICD-10-CM

## 2020-10-15 MED ORDER — LOSARTAN POTASSIUM 100 MG PO TABS: 100.0000 mg | ORAL_TABLET | Freq: Every day | ORAL | 3 refills | Status: DC

## 2020-10-15 NOTE — Telephone Encounter (Addendum)
Left HIPAA compliant voicemail requesting patient to call pharmacy regarding labs and hypertension management.  Renal function and electrolytes within normal limits. Will plan to increase losartan to 100 mg daily and see patient in 3 weeks. Prescription sent to pharmacy.

## 2020-10-15 NOTE — Addendum Note (Signed)
Addended by: Lorel Monaco A on: 10/15/2020 12:25 PM   Modules accepted: Orders

## 2020-10-16 NOTE — Telephone Encounter (Signed)
Unable to leave message requesting patient to call pharmacy.

## 2020-10-21 ENCOUNTER — Telehealth: Payer: Self-pay | Admitting: Pharmacist

## 2020-10-21 DIAGNOSIS — I10 Essential (primary) hypertension: Secondary | ICD-10-CM

## 2020-10-21 NOTE — Telephone Encounter (Signed)
Contacted patient regarding hypertension management. Patient has been experiencing difficulty hearing due to malfunctioning of his cochlear implants, therefore gave permission to speak with his wife Opal Sidles. Explained renal function and electrolytes are normal and okay to increase losartan to 100 mg daily. Patient and wife verbalized understanding. Prescription sent to pharmacy and follow-up visit and labs scheduled in 2 weeks.

## 2020-10-22 ENCOUNTER — Ambulatory Visit: Payer: Medicare Other

## 2020-11-03 NOTE — Progress Notes (Signed)
Patient ID: Michael Miranda                 DOB: 10-23-1941                      MRN: [id number]     HPI: Michael Miranda is a 79 y.o. male referred by Dr. Burt Knack to HTN clinic. PMH is significant for HTN, HLD, depression, hx of alcohol abuse. Pt last seen in HTN clinic on 10/14/20 at which time BP was elevated at 186/86 and losartan was increased to 100 mg daily.  Patient presents today in good spirits. Reports medication adherence and some mild "brain fog" with losartan. Denies dizziness, headaches, blurred vision, and swelling. Patient brought BP monitor and reports home readings range from 120-150s/66-70s with a high of 153/78 and 158/85 (see below).  Current HTN meds: Losartan 100 mg daily (AM) Previously tried: amlodipine 2.5, 5 mg daily (brain fog), lisinopril 20 mg daily ("foggy headed") BP goal: <130/80 mmHg  Family History: heart attack (brother), coronary artery disease (brother), dementia (mother), lung cancer (father)  Social History: denies tobacco abuse; hx of alcohol abuse  Diet:  Breakfast - All brands cereal Lunch - tuna fish sandwich, vegetable soup, scrambled egg Dinner  - chicken breast with vegetables, spaghettti Limits fried food, red meat every other 3rd day Drink - 1 cup of coffee, hot tea, glass a wine/day  Exercise: walk 25 mins (5-10 blocks)  Home BP readings:  BP 143/75, 126/71, 146/75, 150/81, 150/88, 121/66, 153/78, 122/72, 126/73, 158/85, 123/72, 144/77 HR 60s  Clinic BP measured w/Home BP monitor: 175/93, HR 59  Wt Readings from Last 3 Encounters:  09/29/20 130 lb 9.6 oz (59.2 kg)  07/18/20 128 lb 9.6 oz (58.3 kg)  05/07/20 131 lb (59.4 kg)   BP Readings from Last 3 Encounters:  11/04/20 (!) 176/90  10/14/20 (!) 186/86  09/29/20 (!) 172/84   Pulse Readings from Last 3 Encounters:  11/04/20 60  10/14/20 63  09/29/20 64   Renal function: CrCl cannot be calculated (Patient's most recent lab result is older than the maximum 21 days  allowed.).  Past Medical History:  Diagnosis Date  . Alcohol abuse    10 years ago   . Arthritis   . Depression   . Hyperlipidemia   . Hypertension   . Kidney stones    20 years ago   Current Outpatient Medications on File Prior to Visit  Medication Sig Dispense Refill  . Coenzyme Q10 (CO Q-10 PO) Take by mouth.    . losartan (COZAAR) 100 MG tablet Take 1 tablet (100 mg total) by mouth daily. 90 tablet 3  . rosuvastatin (CRESTOR) 5 MG tablet Take 1 tablet (5 mg total) by mouth daily. 90 tablet 3  . zolpidem (AMBIEN) 10 MG tablet TAKE 1 TABLET(10 MG) BY MOUTH AT BEDTIME AS NEEDED FOR SLEEP. 30 tablet 2   No current facility-administered medications on file prior to visit.    No Known Allergies   Assessment/Plan:  1. Hypertension - BP above goal <130/80 mmHg. Clinic BP was elevated at 176/90 and consistent with home BP monitor reading 175/93. Medication adherence appears optimal, however average home BP readings remain moderately elevated. Will plan to start chlorthalidone 25 mg daily pending BMET results drawn today. Discussed potential side effects and encouraged patient to continue checking BP at least 1 hour after taking medications. Patient verbalized understanding. Additionally, patient reported mild brain fog with losartan (similar complaint with amlodipine  and lisinopril). Can consider switching to valsartan at next visit if not resolved. Follow-up appointment and labs scheduled in 2 weeks.  Michael Miranda, PharmD, [place] PGY2 Ambulatory Care Resident [place]

## 2020-11-04 ENCOUNTER — Encounter: Payer: Self-pay | Admitting: Pharmacist

## 2020-11-04 ENCOUNTER — Ambulatory Visit (INDEPENDENT_AMBULATORY_CARE_PROVIDER_SITE_OTHER): Payer: Medicare Other | Admitting: Pharmacist

## 2020-11-04 ENCOUNTER — Other Ambulatory Visit: Payer: Medicare Other | Admitting: *Deleted

## 2020-11-04 ENCOUNTER — Other Ambulatory Visit: Payer: Self-pay

## 2020-11-04 VITALS — BP 176/90 | HR 60

## 2020-11-04 DIAGNOSIS — I10 Essential (primary) hypertension: Secondary | ICD-10-CM | POA: Diagnosis not present

## 2020-11-04 LAB — BASIC METABOLIC PANEL
BUN/Creatinine Ratio: 15 (ref 10–24)
BUN: 16 mg/dL (ref 8–27)
CO2: 25 mmol/L (ref 20–29)
Calcium: 9.2 mg/dL (ref 8.6–10.2)
Chloride: 100 mmol/L (ref 96–106)
Creatinine, Ser: 1.07 mg/dL (ref 0.76–1.27)
GFR calc Af Amer: 76 mL/min/{1.73_m2} (ref >59)
GFR calc non Af Amer: 66 mL/min/{1.73_m2} (ref >59)
Glucose: 97 mg/dL (ref 65–99)
Potassium: 4.6 mmol/L (ref 3.5–5.2)
Sodium: 138 mmol/L (ref 134–144)

## 2020-11-04 NOTE — Patient Instructions (Addendum)
It was nice to see you today!  Your goal blood pressure is <130/80 mmHg. In clinic, your blood pressure was 176/90 mmHg.  Medication Changes: I will give you a call tomorrow once your labs come back, and if normal, will start Chlorthalidone 25 mg daily.  CONTINUE Losartan 100 mg daily  Monitor blood pressure at home daily and keep a log (on your phone or piece of paper) to bring with you to your next visit. Write down date, time, blood pressure and pulse.  Keep up the good work with diet and exercise. Aim for a diet full of vegetables, fruit and lean meats (chicken, [place], fish). Try to limit salt intake by eating fresh or frozen vegetables (instead of canned), rinse canned vegetables prior to cooking and do not add any additional salt to meals.   Please give [place] a call at [phone] with any questions or concerns.

## 2020-11-05 ENCOUNTER — Telehealth: Payer: Self-pay | Admitting: Pharmacist

## 2020-11-05 DIAGNOSIS — I10 Essential (primary) hypertension: Secondary | ICD-10-CM

## 2020-11-05 MED ORDER — CHLORTHALIDONE 25 MG PO TABS: 25.0000 mg | ORAL_TABLET | Freq: Every day | ORAL | 3 refills | Status: DC

## 2020-11-05 NOTE — Telephone Encounter (Signed)
Contacted patient regarding hypertension management. Discussed normal renal function and electrolytes results and will start chlorthalidone 25 mg daily in the mornings. Patient verbalized understanding. Prescription sent to pharmacy and follow-up visit and labs scheduled in 2 weeks.

## 2020-11-15 NOTE — Progress Notes (Signed)
Patient ID: [name]                 DOB: September 22, 1941                      MRN: [id number]     HPI: Michael Pringle Biscoeis a 79 y.o.malereferred by Dr. Marylee Floras HTN clinic. PMH is significant for HTN, HLD, depression, hx of alcohol abuse. Pt was last seen in HTN clinic on 11/04/20 at which time BP was elevated at 176/90 and pt was started on chlorthalidone 25 mg daily.  Patient presents today in good spirits. Reports medication adherence and no side effects with losartan and chlorthalidone. Denies dizziness, headaches, blurred vision and swelling. Home readings range from 125-138/70s with a few readings of 144/76 and 141/83.   Current HTN meds:Losartan 100 mg daily(AM), chlorthalidone 25 mg daily (AM) Previously tried:amlodipine 2.5, 5 mg daily (brain fog), lisinopril 20 mg daily ("foggy headed") BP goal:<130/80 mmHg  Family History:heart attack (brother), coronary artery disease (brother), dementia (mother), lung cancer (father)  Social History:denies tobacco abuse; hx of alcohol abuse  Diet: Breakfast - All brands cereal Lunch - tuna fish sandwich, vegetable soup, scrambled egg Dinner- chicken breast with vegetables, spaghettti Limits fried food, red meat every other 3rd day Drink - 1 cup of coffee, hot tea, glass a wine/day  Exercise:walk 25 mins (5-10 blocks) 5 days a week   Home BP readings: 12/3 - 12/14 BP: 127/69, 144/76, 131/75, 126/74, 131/73, 125/76, 120/71, 138/75, 141/83, 129/71, 144/77 HR 60-70s  Wt Readings from Last 3 Encounters:  09/29/20 130 lb 9.6 oz (59.2 kg)  07/18/20 128 lb 9.6 oz (58.3 kg)  05/07/20 131 lb (59.4 kg)   BP Readings from Last 3 Encounters:  11/18/20 (!) 144/72  11/04/20 (!) 176/90  10/14/20 (!) 186/86   Pulse Readings from Last 3 Encounters:  11/18/20 62  11/04/20 60  10/14/20 63    Renal function: CrCl cannot be calculated (Unknown ideal weight.).  Past Medical History:  Diagnosis Date  . Alcohol abuse    10  years ago   . Arthritis   . Depression   . Hyperlipidemia   . Hypertension   . Kidney stones    20 years ago    Current Outpatient Medications on File Prior to Visit  Medication Sig Dispense Refill  . chlorthalidone (HYGROTON) 25 MG tablet Take 1 tablet (25 mg total) by mouth daily. 90 tablet 3  . Coenzyme Q10 (CO Q-10 PO) Take by mouth.    . losartan (COZAAR) 100 MG tablet Take 1 tablet (100 mg total) by mouth daily. 90 tablet 3  . rosuvastatin (CRESTOR) 5 MG tablet Take 1 tablet (5 mg total) by mouth daily. 90 tablet 3  . zolpidem (AMBIEN) 10 MG tablet TAKE 1 TABLET(10 MG) BY MOUTH AT BEDTIME AS NEEDED FOR SLEEP. 30 tablet 2   No current facility-administered medications on file prior to visit.    No Known Allergies   Assessment/Plan:  1. Hypertension - BP above goal <130/80 mmHg, however clinic BP and home readings have significantly improved since last visit with majority of home readings within target goal. Discussed switching losartan to valsartan which has more BP lowering effects than losartan, however patient will like to continue losartan 100 mg daily and chlorthalidone 25 mg daily, and follow-up in 3 weeks. Will check BMET today with recent initiation of chlorthalidone.   Lorel Monaco, PharmD, [facility]  [place] [address], [place], [place] [address] Phone: [phone]; Fax: [phone]

## 2020-11-17 ENCOUNTER — Telehealth: Payer: Self-pay | Admitting: Adult Health

## 2020-11-17 NOTE — Telephone Encounter (Signed)
Left message for patient to call back and schedule Medicare Annual Wellness Visit (AWV) either virtually or in office.   Last AWV 07/23/16  please schedule at anytime with [facility] Nurse Health Advisor 1 or 2   This should be a 45 minute visit.

## 2020-11-18 ENCOUNTER — Encounter: Payer: Self-pay | Admitting: Pharmacist

## 2020-11-18 ENCOUNTER — Other Ambulatory Visit: Payer: Medicare Other | Admitting: *Deleted

## 2020-11-18 ENCOUNTER — Ambulatory Visit (INDEPENDENT_AMBULATORY_CARE_PROVIDER_SITE_OTHER): Payer: Medicare Other | Admitting: Pharmacist

## 2020-11-18 ENCOUNTER — Other Ambulatory Visit: Payer: Self-pay

## 2020-11-18 VITALS — BP 144/72 | HR 62

## 2020-11-18 DIAGNOSIS — I10 Essential (primary) hypertension: Secondary | ICD-10-CM | POA: Diagnosis not present

## 2020-11-18 LAB — BASIC METABOLIC PANEL
BUN/Creatinine Ratio: 19 (ref 10–24)
BUN: 19 mg/dL (ref 8–27)
CO2: 28 mmol/L (ref 20–29)
Calcium: 9.3 mg/dL (ref 8.6–10.2)
Chloride: 95 mmol/L — ABNORMAL LOW (ref 96–106)
Creatinine, Ser: 1.02 mg/dL (ref 0.76–1.27)
GFR calc Af Amer: 80 mL/min/{1.73_m2} (ref >59)
GFR calc non Af Amer: 70 mL/min/{1.73_m2} (ref >59)
Glucose: 95 mg/dL (ref 65–99)
Potassium: 3.8 mmol/L (ref 3.5–5.2)
Sodium: 136 mmol/L (ref 134–144)

## 2020-11-18 NOTE — Patient Instructions (Addendum)
It was nice to see you today!  Your goal blood pressure is <130/80 mmHg. In clinic, your blood pressure was 144/72 mmHg.  Medication Changes:  Continue losartan 100 mg daily in the mornings  Continue chlorthalidone 25 mg daily in the mornings  Continue walking in the mornings!  Monitor blood pressure at home daily and keep a log (on your phone or piece of paper) to bring with you to your next visit. Write down date, time, blood pressure and pulse.  Keep up the good work with diet and exercise. Aim for a diet full of vegetables, fruit and lean meats (chicken, [place], fish). Try to limit salt intake by eating fresh or frozen vegetables (instead of canned), rinse canned vegetables prior to cooking and do not add any additional salt to meals.   Please give [place] a call at [phone] with any questions or concerns.  See you in 3 weeks on January 4th at 1:30 PM!   HAPPY HOLIDAYS!

## 2020-12-03 ENCOUNTER — Other Ambulatory Visit: Payer: Self-pay | Admitting: Adult Health

## 2020-12-03 DIAGNOSIS — E7800 Pure hypercholesterolemia, unspecified: Secondary | ICD-10-CM

## 2020-12-08 NOTE — Progress Notes (Signed)
Patient ID: Michael Miranda                 DOB: 06-May-1941                      MRN: [id number]     HPI: Michael Miranda is a 80 y.o. male referred by Dr. Madilyn Fireman HTN clinic. PMH is significant for HTN, HLD, depression, hx of alcohol abuse.Pt last seen in HTN on 11/18/20 at which clinic BP was elevated at 144/72, however home readings were within target goal range. No changes were made to HTN medications.  Patient presents today in good spirits. Reports medication adherence with losartan and chlorthalidone. Reports feeling foggy in the mornings. Denies dizziness, headaches, blurred vision and swelling. Home readings range from 111-132/60-70s (see below).  Current HTN meds:Losartan100mg  daily(AM), chlorthalidone 25 mg daily (AM) Previously tried:amlodipine 2.5, 5 mg daily (brain fog), lisinopril 20 mg daily ("foggy headed") BP goal:<130/80 mmHg  Family History:heart attack (brother), coronary artery disease (brother), dementia (mother), lung cancer (father)   Social History:denies tobacco abuse; hx of alcohol abuse  Diet: Breakfast - All brands cereal Lunch - tuna fish sandwich, vegetable soup, scrambled egg Dinner- chicken breast with vegetables, spaghettti Limits fried food, red meat every other 3rd day Drink - 1 cup of coffee, hot tea, glass a wine/day  Exercise:walk 25 mins (5-10 blocks) 5 days a week   Home BP readings: 11/23/20 to 12/09/20 BP: 132/74, 126/75, 142/76, 125/61, 123/70, 111/65, 122/75, 131/72, 133/77, 141/78 HR: 60s  Wt Readings from Last 3 Encounters:  09/29/20 130 lb 9.6 oz (59.2 kg)  07/18/20 128 lb 9.6 oz (58.3 kg)  05/07/20 131 lb (59.4 kg)   BP Readings from Last 3 Encounters:  12/09/20 136/70  11/18/20 (!) 144/72  11/04/20 (!) 176/90   Pulse Readings from Last 3 Encounters:  12/09/20 79  11/18/20 62  11/04/20 60    Renal function: CrCl cannot be calculated (Patient's most recent lab result is older than the maximum 21 days  allowed.).  Past Medical History:  Diagnosis Date  . Alcohol abuse    10 years ago   . Arthritis   . Depression   . Hyperlipidemia   . Hypertension   . Kidney stones    20 years ago    Current Outpatient Medications on File Prior to Visit  Medication Sig Dispense Refill  . chlorthalidone (HYGROTON) 25 MG tablet Take 1 tablet (25 mg total) by mouth daily. 90 tablet 3  . Coenzyme Q10 (CO Q-10 PO) Take by mouth.    . losartan (COZAAR) 100 MG tablet Take 1 tablet (100 mg total) by mouth daily. 90 tablet 3  . rosuvastatin (CRESTOR) 5 MG tablet TAKE 1 TABLET(5 MG) BY MOUTH DAILY 90 tablet 0  . zolpidem (AMBIEN) 10 MG tablet TAKE 1 TABLET(10 MG) BY MOUTH AT BEDTIME AS NEEDED FOR SLEEP. 30 tablet 2   No current facility-administered medications on file prior to visit.    No Known Allergies   Assessment/Plan:  1. Hypertension - BP above goal <130/80 mmHg, however home readings overall are within target BP goal range. Continued losartan 100 mg daily and chlorthalidone 25 mg daily. Encouraged patient to continuing eating a heart healthy diet full of vegetables, fruit and lean meats (chicken, [place], fish) and to limit salt intake. Encouraged patient to continue his morning walks for 20-30 minutes daily with the goal of 150 minutes per week. Patient verbalized understanding. Follow-up appointment scheduled in 2  months.  Fabio Neighbors, PharmD, BCPS PGY2 Ambulatory Care Pharmacy Resident [facility] [address] [place], [place] [address] Phone: [phone]; Fax: [phone]

## 2020-12-09 ENCOUNTER — Ambulatory Visit (INDEPENDENT_AMBULATORY_CARE_PROVIDER_SITE_OTHER): Payer: Medicare Other | Admitting: Pharmacist

## 2020-12-09 ENCOUNTER — Other Ambulatory Visit: Payer: Self-pay

## 2020-12-09 ENCOUNTER — Encounter: Payer: Self-pay | Admitting: Pharmacist

## 2020-12-09 VITALS — BP 136/70 | HR 79

## 2020-12-09 DIAGNOSIS — I10 Essential (primary) hypertension: Secondary | ICD-10-CM

## 2020-12-09 NOTE — Patient Instructions (Addendum)
It was nice to see you today!  Your goal blood pressure is <130/80 mmHg. In clinic, your blood pressure was 136/70 mmHg.  Medication Changes:  Continue losartan 100 mg daily in the mornings  Continue chlorthalidone 25 mg daily in the mornings  Continue walking in the mornings!  Monitor blood pressure at home daily and keep a log (on your phone or piece of paper) to bring with you to your next visit. Write down date, time, blood pressure and pulse.  Keep up the good work with diet and exercise. Aim for a diet full of vegetables, fruit and lean meats (chicken, [place], fish). Try to limit salt intake by eating fresh or frozen vegetables (instead of canned), rinse canned vegetables prior to cooking and do not add any additional salt to meals.   Please give [place] a call at [phone] with any questions or concerns.  See you in 2 months on March 1st at 9:30 AM!

## 2021-01-26 DIAGNOSIS — H903 Sensorineural hearing loss, bilateral: Secondary | ICD-10-CM | POA: Diagnosis not present

## 2021-01-26 DIAGNOSIS — Z9621 Cochlear implant status: Secondary | ICD-10-CM | POA: Diagnosis not present

## 2021-02-02 NOTE — Progress Notes (Signed)
Patient ID: Michael Miranda                 DOB: 1941-04-15                      MRN: [id number]     HPI: Michael Miranda is a 80 y.o. male referred by Dr. Marylee Floras HTN clinic. PMH is significant for HTN, HLD, depression, hx of alcohol abuse.Ptlast seen in HTN clinic on 12/09/20 at which clinic BP 136/70 was near goal and home readings range 111-132/60-70s. No changes were made to HTN medications.  Patient presents today in good spirits. Reports occasional chest heaviness in the morning - pt unsure if this is related to losartan, however takes 0.5 tablet of losartan 100 mg instead of 1 tablet daily as prescribed sometimes. Reports symptoms resolve mid afternoon. Denies chest pain, headaches, dizziness, and blurred vision. Reports medication adherence with chlorthalidone 25 mg daily. Home BP readings range 120s-135/67-70s with a few readings of 146/76 and 140/79; and low of 89/57.  Current HTN meds:Losartan100mg  daily(AM), chlorthalidone 25 mg daily (AM) Previously tried:amlodipine 2.5, 5 mg daily (brain fog), lisinopril 20 mg daily ("foggy headed") BP goal:<130/80 mmHg  Family History:heart attack (brother), coronary artery disease (brother), dementia (mother), lung cancer (father)   Social History:denies tobacco abuse; hx of alcohol abuse  Diet: Breakfast - All brands cereal Lunch - tuna fish sandwich, vegetable soup, scrambled egg Dinner- chicken breast with vegetables, spaghettti Limits fried food, red meat every other 3rd day Drink - 1 cup of coffee, hot tea, glass a wine/day  Exercise:walk 25 mins (5-10 blocks)5 days a week  Home BP readings:  BP: 135/76, 124/74, 140/79, 119/67, 127/68, 89/57, 146/76, 127/71, 110/64, 131/72, 130/75 (average 125/70) HR: 58-72  Wt Readings from Last 3 Encounters:  09/29/20 130 lb 9.6 oz (59.2 kg)  07/18/20 128 lb 9.6 oz (58.3 kg)  05/07/20 131 lb (59.4 kg)   BP Readings from Last 3 Encounters:  02/03/21 (!) 148/70  12/09/20  136/70  11/18/20 (!) 144/72   Pulse Readings from Last 3 Encounters:  02/03/21 62  12/09/20 79  11/18/20 62    Renal function: CrCl cannot be calculated (Patient's most recent lab result is older than the maximum 21 days allowed.).  Past Medical History:  Diagnosis Date  . Alcohol abuse    10 years ago   . Arthritis   . Depression   . Hyperlipidemia   . Hypertension   . Kidney stones    20 years ago    Current Outpatient Medications on File Prior to Visit  Medication Sig Dispense Refill  . chlorthalidone (HYGROTON) 25 MG tablet Take 1 tablet (25 mg total) by mouth daily. 90 tablet 3  . Coenzyme Q10 (CO Q-10 PO) Take by mouth.    . losartan (COZAAR) 100 MG tablet Take 1 tablet (100 mg total) by mouth daily. 90 tablet 3  . rosuvastatin (CRESTOR) 5 MG tablet TAKE 1 TABLET(5 MG) BY MOUTH DAILY 90 tablet 0  . zolpidem (AMBIEN) 10 MG tablet TAKE 1 TABLET(10 MG) BY MOUTH AT BEDTIME AS NEEDED FOR SLEEP. 30 tablet 2   No current facility-administered medications on file prior to visit.    No Known Allergies   Assessment/Plan:  1. Hypertension - BP above goal <130/80 mmHg, however average home BP 125/70 within target BP goal range. Encouraged patient to continue to monitor symptoms of chest heaviness. Continued chlorthalidone 25 mg daily and losartan 100 mg daily. Counseled to  only take 0.5 tablet of Losartan if chest heaviness occurs and to document days this happens. Patient verbalized understanding. Follow-up appointment scheduled in ~5 weeks.  Lorel Monaco, PharmD, [facility] [address] [place], [place] [address] Phone: [phone]; Fax: [phone]

## 2021-02-03 ENCOUNTER — Other Ambulatory Visit: Payer: Self-pay

## 2021-02-03 ENCOUNTER — Encounter: Payer: Self-pay | Admitting: Pharmacist

## 2021-02-03 ENCOUNTER — Ambulatory Visit (INDEPENDENT_AMBULATORY_CARE_PROVIDER_SITE_OTHER): Payer: Medicare Other | Admitting: Pharmacist

## 2021-02-03 VITALS — BP 148/70 | HR 62

## 2021-02-03 DIAGNOSIS — I10 Essential (primary) hypertension: Secondary | ICD-10-CM | POA: Diagnosis not present

## 2021-02-03 DIAGNOSIS — E782 Mixed hyperlipidemia: Secondary | ICD-10-CM | POA: Diagnosis not present

## 2021-02-03 NOTE — Patient Instructions (Addendum)
It was nice to see you today!  Your goal blood pressure is <130/80 mmHg. In clinic, your blood pressure was 148/70 mmHg.  Medication Changes:  Continue taking chlorthalidone 25 mg daily   Continue taking losartan 100 mg daily. Only take half a tablet if necessary and log in your BP book  Monitor blood pressure at home daily and keep a log (on your phone or piece of paper) to bring with you to your next visit. Write down date, time, blood pressure and pulse.  Keep up the good work with diet and exercise. Aim for a diet full of vegetables, fruit and lean meats (chicken, [place], fish). Try to limit salt intake by eating fresh or frozen vegetables (instead of canned), rinse canned vegetables prior to cooking and do not add any additional salt to meals.   Please give [place] a call at [phone] with any questions or concerns.

## 2021-02-13 ENCOUNTER — Ambulatory Visit: Payer: Medicare Other

## 2021-03-03 ENCOUNTER — Other Ambulatory Visit: Payer: Self-pay | Admitting: Adult Health

## 2021-03-03 ENCOUNTER — Other Ambulatory Visit: Payer: Self-pay | Admitting: Cardiovascular Disease

## 2021-03-03 DIAGNOSIS — E7800 Pure hypercholesterolemia, unspecified: Secondary | ICD-10-CM

## 2021-04-06 NOTE — Progress Notes (Signed)
Patient ID: Michael Miranda                 DOB: 01/25/1941                      MRN: [id number]     HPI: Michael Fentress Biscoeis a 80 y.o.malereferred by Dr. Marylee Floras HTN clinic. PMH is significant for HTN, HLD, depression, hx of alcohol abuse.Pt last seen in HTN clinic on 02/03/21 and BP was 148/70. Home readings averaged 125/70. Additionally, pt reported occasional chest heaviness in the morning - pt unsure if related to losartan, but was taking losartan 50 mg daily instead of 100 mg daily as prescribed sometimes. Pt was continued on chlorthalidone 25 mg daily and instructed to take 0.5 tablet of losartan if chest heaviness occurs and to document days this happens, otherwise to take 100 mg daily as prescribed.  Patient presents today in good spirits. Reports overall taking BP medications between 6:30-7AM. Reports still splitting losartan 100 mg tablet in half 1 out of 3 days due to feeling "empty-minded or empty-headed." Denies chest heaviness or discomfort. Reports medication adherence with chlorthalidone 25 mg daily. This morning, patient reports taking 1/2 of losartan 100 mg daily around 6:30 AM and the other 1/2 around 7:30-8AM. Reports taking chlorthalidone 25 mg today. Reports some dizziness late in the mornings, however no issues with gait or imbalances. Denies headaches, blurred vision, and LE swelling. Based on BP log below, when taking HTN medications as prescribed, BP ranges 117-130/60-70s and if only taking 1/2 of losartan + chlorthalidone, BP ranges 130-140s/60-70s. Reports walking 1 out of 2 days between 8-10 AM and helps lower his BP.  Current HTN meds:Losartan170m daily(AM), chlorthalidone 25 mg daily (AM) Previously tried:amlodipine 2.5, 5 mg daily (brain fog), lisinopril 20 mg daily ("foggy headed") BP goal:<130/80 mmHg  Family History:heart attack (brother), coronary artery disease (brother), dementia (mother), lung cancer (father)  Social History:denies tobacco abuse;  hx of alcohol abuse  Diet: Breakfast - All brands cereal Lunch - tuna fish sandwich, vegetable soup, scrambled egg Dinner- chicken breast with vegetables, spaghettti Limits fried food, red meat every other 3rd day Drink - 1 cup of coffee, hot tea, glass a wine/day  Exercise:walk 25 mins (5-10 blocks)5 days a week  Home BP readings:  Date Time BP Losartan 100 mg   7-Apr 11:25 AM 131/74 yes  8-Apr 8:55 AM 127/67 yes   3:05 PM 137/76 yes  9-Apr 8:40 AM 136/72 only 1/2 tab   12:45 PM 131/71   10-Apr  147/78 only 1/2 tab  11-Apr 8:45 AM 144/77 only 1/2 tab   9:30 AM 155/80    7:30 PM 85/59 only 1/2 tab   8:20 PM 130/69   12-Apr 8:05 AM 128/73 only 1/2 tab  13-Apr 9:50 AM 141/73   16-Apr 11:05 AM 136/77 yes  17-Apr 9:15 AM 130/70 yes   1:00 PM 123/71 yes  18-Apr 1:05 PM 125/75 yes  19-Apr 9:05 PM 122/70 yes  21-Apr 10:30 AM 117/72 yes  22-Apr 9:00 AM 133/73 yes  24-Apr 10:20 AM 117/68 only 1/2 tab  26-Apr 9:20 AM 124/68 only 1/2 tab  29-Apr 3:15 PM 121/69 yes  28-Apr 9:30 AM 128/71 yes  30-Apr 2:25 PM 129/73 yes  04/05/2021 2:55 PM 124/72 yes  04/06/2021 10:05 AM 136/77 only 1/2 tab   3:40 PM 125/73   04/07/2021 8:00 AM 149/79 only 1/2 tab   8:30 AM 133/72    Labs: 11/18/20: Scr 1.02, eGFR  70, K, 3.8, Na 136  Wt Readings from Last 3 Encounters:  09/29/20 130 lb 9.6 oz (59.2 kg)  07/18/20 128 lb 9.6 oz (58.3 kg)  05/07/20 131 lb (59.4 kg)   BP Readings from Last 3 Encounters:  04/07/21 (!) 160/76  02/03/21 (!) 148/70  12/09/20 136/70   Pulse Readings from Last 3 Encounters:  04/07/21 60  02/03/21 62  12/09/20 79    Renal function: CrCl cannot be calculated (Patient's most recent lab result is older than the maximum 21 days allowed.).  Past Medical History:  Diagnosis Date  . Alcohol abuse    10 years ago   . Arthritis   . Depression   . Hyperlipidemia   . Hypertension   . Kidney stones    20 years ago    Current Outpatient Medications on File  Prior to Visit  Medication Sig Dispense Refill  . chlorthalidone (HYGROTON) 25 MG tablet Take 1 tablet (25 mg total) by mouth daily. 90 tablet 3  . Coenzyme Q10 (CO Q-10 PO) Take by mouth.    . losartan (COZAAR) 100 MG tablet Take 1 tablet (100 mg total) by mouth daily. 90 tablet 3  . rosuvastatin (CRESTOR) 5 MG tablet TAKE 1 TABLET(5 MG) BY MOUTH DAILY 90 tablet 0  . zolpidem (AMBIEN) 10 MG tablet TAKE 1 TABLET(10 MG) BY MOUTH AT BEDTIME AS NEEDED FOR SLEEP. 30 tablet 2   No current facility-administered medications on file prior to visit.    No Known Allergies   Assessment/Plan:  1. Hypertension - Clinic BP above goal <130/80 mmHg, however when BP medications are taken as prescribed, overall BP readings within target goal range. Medication adherence is okay - patient often takes losartan 50 mg daily instead of 100 mg daily due to feeling foggy-headed, which he has reported feeling on many BP medications. Encouraged patient to take losartan 100 mg daily as prescribed and continue chlorthalidone 64m daily and to monitor for symptoms of dizziness or falls. Patient verbalized understanding. Discussed trying another ARB at next visit if patient still experiencing symptoms of brain fog. Patient verbalized understanding. However, symptoms most likely unrelated to BP medications as patient has been reporting brain fog with other medications that have now been discontinued. Continued losartan 100 mg daily and chlorthalidone 25 mg daily. Follow-up appointment scheduled in ~5 weeks.  ILorel Monaco PharmD, [id number] [place] [address]Phone: [phone] Fax: [phone]

## 2021-04-07 ENCOUNTER — Encounter: Payer: Self-pay | Admitting: Pharmacist

## 2021-04-07 ENCOUNTER — Other Ambulatory Visit: Payer: Self-pay

## 2021-04-07 ENCOUNTER — Ambulatory Visit (INDEPENDENT_AMBULATORY_CARE_PROVIDER_SITE_OTHER): Payer: Medicare Other | Admitting: Pharmacist

## 2021-04-07 VITALS — BP 160/76 | HR 60

## 2021-04-07 DIAGNOSIS — I10 Essential (primary) hypertension: Secondary | ICD-10-CM

## 2021-04-07 NOTE — Patient Instructions (Addendum)
It was nice to see you today!  Your goal blood pressure is <130/80 mmHg.   Medication Changes:  Continue chlorthalidone 25 mg daily  Monitor your symptoms but I encouraged you to take 1 tablet of Losartan 100 mg daily as long as you are not feeling dizzy or experiencing falls. Give [place] a call if you have any questions or concerns.  Monitor blood pressure at home daily and keep a log (on your phone or piece of paper) to bring with you to your next visit. Write down date, time, blood pressure and pulse.  Keep up the good work with diet and exercise. Aim for a diet full of vegetables, fruit and lean meats (chicken, [place], fish). Try to limit salt intake by eating fresh or frozen vegetables (instead of canned), rinse canned vegetables prior to cooking and do not add any additional salt to meals.   Please give [place] a call at [phone] with any questions or concerns.

## 2021-04-21 ENCOUNTER — Ambulatory Visit (INDEPENDENT_AMBULATORY_CARE_PROVIDER_SITE_OTHER): Payer: Medicare Other

## 2021-04-21 ENCOUNTER — Other Ambulatory Visit: Payer: Self-pay

## 2021-04-21 DIAGNOSIS — Z0000 Encounter for general adult medical examination without abnormal findings: Secondary | ICD-10-CM

## 2021-04-21 NOTE — Progress Notes (Signed)
Wife Bassel Gaskill assisted with AWV questions and concerns

## 2021-04-21 NOTE — Patient Instructions (Addendum)
Michael Miranda , Thank you for taking time to come for your Medicare Wellness Visit. I appreciate your ongoing commitment to your health goals. Please review the following plan we discussed and let me know if I can assist you in the future.   Screening recommendations/referrals: Colonoscopy: No longer required at this time Recommended yearly ophthalmology/optometry visit for glaucoma screening and checkup Recommended yearly dental visit for hygiene and checkup  Vaccinations: Influenza vaccine: Up to date Pneumococcal vaccine: Up to date Tdap vaccine: Up to date Shingles vaccine: Shingrix discussed. Please contact your pharmacy for coverage information.    Covid-19: Completed 1/26 & 01/29/20 Pt will bring Booster date in with visit   Advanced directives: Please bring a copy of your health care power of attorney and living will to the office at your convenience.  Conditions/risks identified: None at this time   Next appointment: Follow up in one year for your annual wellness visit.   Preventive Care 44 Years and Older, Male Preventive care refers to lifestyle choices and visits with your health care provider that can promote health and wellness. What does preventive care include?  A yearly physical exam. This is also called an annual well check.  Dental exams once or twice a year.  Routine eye exams. Ask your health care provider how often you should have your eyes checked.  Personal lifestyle choices, including:  Daily care of your teeth and gums.  Regular physical activity.  Eating a healthy diet.  Avoiding tobacco and drug use.  Limiting alcohol use.  Practicing safe sex.  Taking low doses of aspirin every day.  Taking vitamin and mineral supplements as recommended by your health care provider. What happens during an annual well check? The services and screenings done by your health care provider during your annual well check will depend on your age, overall health,  lifestyle risk factors, and family history of disease. Counseling  Your health care provider may ask you questions about your:  Alcohol use.  Tobacco use.  Drug use.  Emotional well-being.  Home and relationship well-being.  Sexual activity.  Eating habits.  History of falls.  Memory and ability to understand (cognition).  Work and work Statistician. Screening  You may have the following tests or measurements:  Height, weight, and BMI.  Blood pressure.  Lipid and cholesterol levels. These may be checked every 5 years, or more frequently if you are over 52 years old.  Skin check.  Lung cancer screening. You may have this screening every year starting at age 72 if you have a 30-pack-year history of smoking and currently smoke or have quit within the past 15 years.  Fecal occult blood test (FOBT) of the stool. You may have this test every year starting at age 78.  Flexible sigmoidoscopy or colonoscopy. You may have a sigmoidoscopy every 5 years or a colonoscopy every 10 years starting at age 15.  Prostate cancer screening. Recommendations will vary depending on your family history and other risks.  Hepatitis C blood test.  Hepatitis B blood test.  Sexually transmitted disease (STD) testing.  Diabetes screening. This is done by checking your blood sugar (glucose) after you have not eaten for a while (fasting). You may have this done every 1-3 years.  Abdominal aortic aneurysm (AAA) screening. You may need this if you are a current or former smoker.  Osteoporosis. You may be screened starting at age 58 if you are at high risk. Talk with your health care provider about your test  results, treatment options, and if necessary, the need for more tests. Vaccines  Your health care provider may recommend certain vaccines, such as:  Influenza vaccine. This is recommended every year.  Tetanus, diphtheria, and acellular pertussis (Tdap, Td) vaccine. You may need a Td booster  every 10 years.  Zoster vaccine. You may need this after age 27.  Pneumococcal 13-valent conjugate (PCV13) vaccine. One dose is recommended after age 65.  Pneumococcal polysaccharide (PPSV23) vaccine. One dose is recommended after age 30. Talk to your health care provider about which screenings and vaccines you need and how often you need them. This information is not intended to replace advice given to you by your health care provider. Make sure you discuss any questions you have with your health care provider. Document Released: 12/19/2015 Document Revised: 08/11/2016 Document Reviewed: 09/23/2015 Elsevier Interactive Patient Education  2017 [place] Prevention in the Home Falls can cause injuries. They can happen to people of all ages. There are many things you can do to make your home safe and to help prevent falls. What can I do on the outside of my home?  Regularly fix the edges of walkways and driveways and fix any cracks.  Remove anything that might make you trip as you walk through a door, such as a raised step or threshold.  Trim any bushes or trees on the path to your home.  Use bright outdoor lighting.  Clear any walking paths of anything that might make someone trip, such as rocks or tools.  Regularly check to see if handrails are loose or broken. Make sure that both sides of any steps have handrails.  Any raised decks and porches should have guardrails on the edges.  Have any leaves, snow, or ice cleared regularly.  Use sand or salt on walking paths during winter.  Clean up any spills in your garage right away. This includes oil or grease spills. What can I do in the bathroom?  Use night lights.  Install grab bars by the toilet and in the tub and shower. Do not use towel bars as grab bars.  Use non-skid mats or decals in the tub or shower.  If you need to sit down in the shower, use a plastic, non-slip stool.  Keep the floor dry. Clean up any  water that spills on the floor as soon as it happens.  Remove soap buildup in the tub or shower regularly.  Attach bath mats securely with double-sided non-slip rug tape.  Do not have throw rugs and other things on the floor that can make you trip. What can I do in the bedroom?  Use night lights.  Make sure that you have a light by your bed that is easy to reach.  Do not use any sheets or blankets that are too big for your bed. They should not hang down onto the floor.  Have a firm chair that has side arms. You can use this for support while you get dressed.  Do not have throw rugs and other things on the floor that can make you trip. What can I do in the kitchen?  Clean up any spills right away.  Avoid walking on wet floors.  Keep items that you use a lot in easy-to-reach places.  If you need to reach something above you, use a strong step stool that has a grab bar.  Keep electrical cords out of the way.  Do not use floor polish or wax that makes  floors slippery. If you must use wax, use non-skid floor wax.  Do not have throw rugs and other things on the floor that can make you trip. What can I do with my stairs?  Do not leave any items on the stairs.  Make sure that there are handrails on both sides of the stairs and use them. Fix handrails that are broken or loose. Make sure that handrails are as long as the stairways.  Check any carpeting to make sure that it is firmly attached to the stairs. Fix any carpet that is loose or worn.  Avoid having throw rugs at the top or bottom of the stairs. If you do have throw rugs, attach them to the floor with carpet tape.  Make sure that you have a light switch at the top of the stairs and the bottom of the stairs. If you do not have them, ask someone to add them for you. What else can I do to help prevent falls?  Wear shoes that:  Do not have high heels.  Have rubber bottoms.  Are comfortable and fit you well.  Are closed  at the toe. Do not wear sandals.  If you use a stepladder:  Make sure that it is fully opened. Do not climb a closed stepladder.  Make sure that both sides of the stepladder are locked into place.  Ask someone to hold it for you, if possible.  Clearly mark and make sure that you can see:  Any grab bars or handrails.  First and last steps.  Where the edge of each step is.  Use tools that help you move around (mobility aids) if they are needed. These include:  Canes.  Walkers.  Scooters.  Crutches.  Turn on the lights when you go into a dark area. Replace any light bulbs as soon as they burn out.  Set up your furniture so you have a clear path. Avoid moving your furniture around.  If any of your floors are uneven, fix them.  If there are any pets around you, be aware of where they are.  Review your medicines with your doctor. Some medicines can make you feel dizzy. This can increase your chance of falling. Ask your doctor what other things that you can do to help prevent falls. This information is not intended to replace advice given to you by your health care provider. Make sure you discuss any questions you have with your health care provider. Document Released: 09/18/2009 Document Revised: 04/29/2016 Document Reviewed: 12/27/2014 Elsevier Interactive Patient Education  2017 [employer].

## 2021-04-21 NOTE — Progress Notes (Signed)
Virtual Visit via Telephone Note  I connected with  Michael Miranda on 04/21/21 at 10:15 AM EDT by telephone and verified that I am speaking with the correct person using two identifiers.  Medicare Annual Wellness visit completed telephonically due to Covid-19 pandemic.   Persons participating in this call: This Health Coach and this patient.   Location: Patient: Home Provider: Office   I discussed the limitations, risks, security and privacy concerns of performing an evaluation and management service by telephone and the availability of in person appointments. The patient expressed understanding and agreed to proceed.  Unable to perform video visit due to video visit attempted and failed and/or patient does not have video capability.   Some vital signs may be absent or patient reported.   Willette Brace, LPN    Subjective:   Michael Miranda is a 80 y.o. male who presents for Medicare Annual/Subsequent preventive examination.  Review of Systems     Cardiac Risk Factors include: advanced age (>59men, >78 women);hypertension;male gender     Objective:    There were no vitals filed for this visit. There is no height or weight on file to calculate BMI.  Advanced Directives 04/21/2021 11/18/2016 07/23/2016 07/23/2016  Does Patient Have a Medical Advance Directive? Yes Yes Yes Yes  Type of Paramedic of [place];Living will - -  Copy of [place] in Chart? No - copy requested Yes No - copy requested No - copy requested    Current Medications (verified) Outpatient Encounter Medications as of 04/21/2021  Medication Sig  . rosuvastatin (CRESTOR) 5 MG tablet TAKE 1 TABLET(5 MG) BY MOUTH DAILY  . chlorthalidone (HYGROTON) 25 MG tablet Take 1 tablet (25 mg total) by mouth daily.  Marland Kitchen losartan (COZAAR) 100 MG tablet Take 1 tablet (100 mg total) by mouth daily.  Marland Kitchen zolpidem (AMBIEN) 10 MG tablet TAKE 1 TABLET(10 MG)  BY MOUTH AT BEDTIME AS NEEDED FOR SLEEP. (Patient not taking: Reported on 04/21/2021)   No facility-administered encounter medications on file as of 04/21/2021.    Allergies (verified) Patient has no known allergies.   History: Past Medical History:  Diagnosis Date  . Alcohol abuse    10 years ago   . Arthritis   . Depression   . Hyperlipidemia   . Hypertension   . Kidney stones    20 years ago   Past Surgical History:  Procedure Laterality Date  . COCHLEAR IMPLANT     bilateral  . HERNIA REPAIR     Family History  Problem Relation Age of Onset  . Heart attack Brother   . Coronary artery disease Brother   . Cancer Father        lung  . Dementia Mother    Social History   Socioeconomic History  . Marital status: Married    Spouse name: Not on file  . Number of children: Not on file  . Years of education: Not on file  . Highest education level: Not on file  Occupational History  . Not on file  Tobacco Use  . Smoking status: Never Smoker  . Smokeless tobacco: Never Used  Substance and Sexual Activity  . Alcohol use: Yes    Alcohol/week: 0.0 standard drinks    Comment: Wine with dinner  . Drug use: No  . Sexual activity: Not on file  Other Topics Concern  . 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    Social Determinants of Health   Financial Resource Strain: Low Risk   . Difficulty of Paying Living Expenses: Not hard at all  Food Insecurity: No Food Insecurity  . Worried About Charity fundraiser in the Last Year: Never true  . Ran Out of Food in the Last Year: Never true  Transportation Needs: No Transportation Needs  . Lack of Transportation (Medical): No  . Lack of Transportation (Non-Medical): No  Physical Activity: Insufficiently Active  . Days of Exercise per Week: 4 days  .  Minutes of Exercise per Session: 20 min  Stress: No Stress Concern Present  . Feeling of Stress : Not at all  Social Connections: Moderately Isolated  . Frequency of Communication with Friends and Family: Twice a week  . Frequency of Social Gatherings with Friends and Family: Never  . Attends Religious Services: 1 to 4 times per year  . Active Member of Clubs or Organizations: No  . Attends Archivist Meetings: Never  . Marital Status: Married    Tobacco Counseling Counseling given: Not Answered   Clinical Intake:  Pre-visit preparation completed: Yes  Pain : No/denies pain     BMI - recorded: 19.29 Nutritional Status: BMI of 19-24  Normal Nutritional Risks: None Diabetes: No  How often do you need to have someone help you when you read instructions, pamphlets, or other written materials from your doctor or pharmacy?: 1 - Never  Diabetic?No  Interpreter Needed?: No  Information entered by :: Charlott Rakes, LPN   Activities of Daily Living In your present state of health, do you have any difficulty performing the following activities: 04/21/2021  Hearing? Y  Comment bilaterally cochlear implants  Vision? N  Difficulty concentrating or making decisions? Y  Comment short term memory  Walking or climbing stairs? N  Dressing or bathing? N  Doing errands, shopping? N  Preparing Food and eating ? N  Using the Toilet? N  In the past six months, have you accidently leaked urine? N  Do you have problems with loss of bowel control? N  Managing your Medications? N  Managing your Finances? N  Housekeeping or managing your Housekeeping? N  Some recent data might be hidden    Patient Care Team: Dorothyann Peng, NP as PCP - General (Family Medicine) Sherren Mocha, MD as PCP - Cardiology (Cardiology)  Indicate any recent Medical Services you may have received from other than [facility] providers in the past year (date may be approximate).     Assessment:   This  is a routine wellness examination for Michael Miranda.  Hearing/Vision screen  Hearing Screening   125Hz  250Hz  500Hz  1000Hz  2000Hz  3000Hz  4000Hz  6000Hz  8000Hz   Right ear:           Left ear:           Comments: Pt wears a double cochlear implants bilaterally  Vision Screening Comments: Pt follows up with Dr Kathlen Mody for annual eye exams   Dietary issues and exercise activities discussed: Current Exercise Habits: Home exercise routine, Type of exercise: walking, Time (Minutes): 15, Frequency (Times/Week): 4, Weekly Exercise (Minutes/Week): 60  Goals Addressed            This Visit's Progress   . Patient Stated  None at this time      Depression Screen PHQ 2/9 Scores 04/21/2021 12/12/2018 10/24/2018 08/29/2018 11/22/2017 11/27/2014 10/01/2013  PHQ - 2 Score 1 0 0 2 0 0 1  PHQ- 9 Score - - - 9 - - 3    Fall Risk Fall Risk  04/21/2021 12/12/2018 10/24/2018 08/29/2018 11/22/2017  Falls in the past year? 0 0 0 No No  Number falls in past yr: 0 - - - -  Injury with Fall? 0 - - - -  Risk for fall due to : Impaired vision - - - -  Follow up Falls prevention discussed - - - -    FALL RISK PREVENTION PERTAINING TO THE HOME:  Any stairs in or around the home? Yes  If so, are there any without handrails? No  Home free of loose throw rugs in walkways, pet beds, electrical cords, etc? Yes  Adequate lighting in your home to reduce risk of falls? Yes   ASSISTIVE DEVICES UTILIZED TO PREVENT FALLS:  Life alert? No  Use of a cane, walker or w/c? No  Grab bars in the bathroom? Yes  Shower chair or bench in shower? No  Elevated toilet seat or a handicapped toilet? No   TIMED UP AND GO:  Was the test performed? No .      Cognitive Function: MMSE - Mini Mental State Exam 07/23/2016  Not completed: (No Data)        Immunizations Immunization History  Administered Date(s) Administered  . Influenza Split 09/18/2012  . Influenza, High Dose Seasonal PF 10/01/2013, 09/18/2015, 09/02/2017,  09/30/2018, 09/13/2019  . Influenza-Unspecified 08/29/2020  . Moderna Sars-Covid-2 Vaccination 01/01/2020, 01/29/2020  . Pneumococcal Conjugate-13 01/15/2015  . Pneumococcal Polysaccharide-23 12/06/2009  . Td 11/27/2014  . Zoster 12/06/2006    TDAP status: Up to date  Flu Vaccine status: Up to date  Pneumococcal vaccine status: Up to date  Covid-19 vaccine status: Completed vaccines  Qualifies for Shingles Vaccine? Yes   Zostavax completed Yes   Shingrix Completed?: No.    Education has been provided regarding the importance of this vaccine. Patient has been advised to call insurance company to determine out of pocket expense if they have not yet received this vaccine. Advised may also receive vaccine at local pharmacy or Health Dept. Verbalized acceptance and understanding.  Screening Tests Health Maintenance  Topic Date Due  . Hepatitis C Screening  Never done  . COVID-19 Vaccine (3 - Moderna risk 4-dose series) 02/26/2020  . INFLUENZA VACCINE  07/06/2021  . TETANUS/TDAP  11/27/2024  . PNA vac Low Risk Adult  Completed  . HPV VACCINES  Aged Out    Health Maintenance  Health Maintenance Due  Topic Date Due  . Hepatitis C Screening  Never done  . COVID-19 Vaccine (3 - Moderna risk 4-dose series) 02/26/2020    Colorectal cancer screening: No longer required.    Additional Screening:  Hepatitis C Screening: does qualify  Vision Screening: Recommended annual ophthalmology exams for early detection of glaucoma and other disorders of the eye. Is the patient up to date with their annual eye exam?  Yes  Who is the provider or what is the name of the office in which the patient attends annual eye exams? Dr Kathlen Mody  If pt is not established with a provider, would they like to be referred to a provider to establish care? No .   Dental Screening: Recommended annual dental exams for proper oral hygiene  Community Resource Referral / Chronic  Care Management: CRR required this  visit?  No   CCM required this visit?  No      Plan:     I have personally reviewed and noted the following in the patient's chart:   . Medical and social history . Use of alcohol, tobacco or illicit drugs  . Current medications and supplements including opioid prescriptions. Patient is not currently taking opioid prescriptions. . Functional ability and status . Nutritional status . Physical activity . Advanced directives . List of other physicians . Hospitalizations, surgeries, and ER visits in previous 12 months . Vitals . Screenings to include cognitive, depression, and falls . Referrals and appointments  In addition, I have reviewed and discussed with patient certain preventive protocols, quality metrics, and best practice recommendations. A written personalized care plan for preventive services as well as general preventive health recommendations were provided to patient.     Willette Brace, LPN   05/21/736   Nurse Notes An Appt has been scheduled 04/23/21 @ 9:30 am  Pt is requesting to have labs CBC PSA, Cholesterol and hep c screening is due.  Before he sees you on this 5/19 day he wanted labs first. . Pt Has concerns about taking "cholesterol med and Losartan  Medication due to his level of cholesterol have improved per pt and the losartan may be affecting his memory issues" Pt is also requesting that his Ambien be refilled related to he has not been able to sleep well for a couple of nights. Pt also request a bone density screening . I have spoken with Pt about colonoscopy and he stated no issues or concerns in that area

## 2021-04-23 ENCOUNTER — Ambulatory Visit: Payer: Medicare Other | Admitting: Adult Health

## 2021-05-11 NOTE — Progress Notes (Signed)
Patient ID: ABAS LEICHT                 DOB: 17-Mar-1941                      MRN: [id number]     HPI: Michael Miranda a 80 y.o.malereferred by Dr. Marylee Floras HTN clinic. PMH is significant for HTN, HLD, depression, hx of alcohol abuse.Pt last seen in HTN clinic on 04/07/21 and BP was 160/76, however overall home readings were within target goal range. Pt reported sometimes taking losartan 50 mg instead of 100 mg daily due to feeling foggy-headed (although he has reported this same feeling on many other BP medications). Discussed switching to another ARB, however pt preferred to continue current medications (losartan 100 mg daily and chlorthalidone 25 mg daily).  Patient presents today in good spirits. Reports taking losartan 50 mg daily instead of 100 mg daily as prescribed and sometimes skips entire dose. Reports taking 0.5 tablet of losartan 100 mg lessens the foggy-headedness, confusion, chest heaviness, and dizziness. Denies headaches. Reports medication adherence with chlorthalidone 25 mg daily. Based on BP log below, average home BP 128/71.   Current HTN meds:Losartan100mg  daily(taking 0.5 tablet), chlorthalidone 25 mg daily (AM) Previously tried:amlodipine 2.5, 5 mg daily (brain fog), lisinopril 20 mg daily ("foggy headed") BP goal:<130/80 mmHg  Family History:heart attack (brother), coronary artery disease (brother), dementia (mother), lung cancer (father)  Social History:denies tobacco abuse; hx of alcohol abuse  Diet: Breakfast - All brands cereal Lunch - tuna fish sandwich, vegetable soup, scrambled egg Dinner- chicken breast with vegetables, spaghettti Limits fried food, red meat every other 3rd day Drink - 1 cup of coffee, hot tea, glass a wine/day  Exercise:walk 25 mins (5-10 blocks)5 days a week  Home BP readings:   Date SBP DBP Pulse   4-May 122 70 60   8-May 136 71 62   13-May 142 77 57 *before walk   121 72 61 *after walk  14-May 131 75 58     136 72 58   16-May 119 69 61    119 63 64   19-May 108 59 68    107 61 61   20-May 119 68 63   21-May 117 66 67   22-May 122 71 63   27-May 127 72 58   28-May 141 76 64 Skipped losartan  29-May 147 79 59 Skipped losartan  30-May 140 80 60    143 79 58   1-Jun 125 71 58   2-Jun 126 73 61   3-Jun 116 67 68   4-Jun 142 77 62   6-Jun 111 62 70    132 74 66   7-Jun 141 70  *before walk        AVG 128 71 62     Wt Readings from Last 3 Encounters:  09/29/20 130 lb 9.6 oz (59.2 kg)  07/18/20 128 lb 9.6 oz (58.3 kg)  05/07/20 131 lb (59.4 kg)   BP Readings from Last 3 Encounters:  04/07/21 (!) 160/76  02/03/21 (!) 148/70  12/09/20 136/70   Pulse Readings from Last 3 Encounters:  04/07/21 60  02/03/21 62  12/09/20 79    Renal function: CrCl cannot be calculated (Patient's most recent lab result is older than the maximum 21 days allowed.).  Past Medical History:  Diagnosis Date  . Alcohol abuse    10 years ago   . Arthritis   . Depression   .  Hyperlipidemia   . Hypertension   . Kidney stones    20 years ago    Current Outpatient Medications on File Prior to Visit  Medication Sig Dispense Refill  . chlorthalidone (HYGROTON) 25 MG tablet Take 1 tablet (25 mg total) by mouth daily. 90 tablet 3  . losartan (COZAAR) 100 MG tablet Take 1 tablet (100 mg total) by mouth daily. 90 tablet 3  . rosuvastatin (CRESTOR) 5 MG tablet TAKE 1 TABLET(5 MG) BY MOUTH DAILY 90 tablet 0  . zolpidem (AMBIEN) 10 MG tablet TAKE 1 TABLET(10 MG) BY MOUTH AT BEDTIME AS NEEDED FOR SLEEP. (Patient not taking: Reported on 04/21/2021) 30 tablet 2   No current facility-administered medications on file prior to visit.   No Known Allergies  Assessment/Plan:  1. Hypertension - Clinic BP above goal <130/80 mmHg, however home readings within target goal range. Medication adherence appears fair as he is complaint with chlorthalidone 25 mg daily, however only taking losartan 50 mg instead of 100 mg  daily as prescribed primarily due to reported foggy-headedness and confusion. Symptoms most likely unrelated to BP medications as patient has been reporting brain fog with other medications that have now been discontinued. Discussed trying another ARB to see if symptoms resolve. Patient amendable to plan. Will discontinue losartan and start valsartan 160 mg daily. Continued chlorthalidone 25 mg daily. Last BMET in 11/2020 wnl and patient plans to check labs tomorrow at PCP appointment. Follow-up appointment scheduled in 6 weeks to assess symptoms and BP control.   Lorel Monaco, PharmD, [facility] PGY2 Ambulatory Care Pharmacy Resident. [place] [address] [place], [place] [address] Phone: [phone]; Fax: [phone]

## 2021-05-12 ENCOUNTER — Ambulatory Visit (INDEPENDENT_AMBULATORY_CARE_PROVIDER_SITE_OTHER): Payer: Medicare Other | Admitting: Pharmacist

## 2021-05-12 ENCOUNTER — Encounter: Payer: Self-pay | Admitting: Pharmacist

## 2021-05-12 ENCOUNTER — Other Ambulatory Visit: Payer: Self-pay

## 2021-05-12 VITALS — BP 156/70 | HR 61

## 2021-05-12 DIAGNOSIS — I10 Essential (primary) hypertension: Secondary | ICD-10-CM

## 2021-05-12 MED ORDER — CHLORTHALIDONE 25 MG PO TABS: 25.0000 mg | ORAL_TABLET | Freq: Every day | ORAL | 3 refills | Status: DC

## 2021-05-12 MED ORDER — VALSARTAN 160 MG PO TABS: 160.0000 mg | ORAL_TABLET | Freq: Every day | ORAL | 5 refills | Status: DC

## 2021-05-12 NOTE — Patient Instructions (Addendum)
It was nice to see you today!  Your goal blood pressure is <130/80 mmHg.  Medication Changes: Begin taking valsartan 160 mg daily (1 tablet)  Stop taking losartan  Continue taking chlorthalidone 25 mg daily (1 tablet)  Monitor blood pressure at home daily and keep a log (on your phone or piece of paper) to bring with you to your next visit. Write down date, time, blood pressure and pulse.  Keep up the good work with diet and exercise. Aim for a diet full of vegetables, fruit and lean meats (chicken, [place], fish). Try to limit salt intake by eating fresh or frozen vegetables (instead of canned), rinse canned vegetables prior to cooking and do not add any additional salt to meals.   See you in 6 weeks!

## 2021-05-13 ENCOUNTER — Ambulatory Visit (INDEPENDENT_AMBULATORY_CARE_PROVIDER_SITE_OTHER): Payer: Medicare Other | Admitting: Adult Health

## 2021-05-13 ENCOUNTER — Encounter: Payer: Self-pay | Admitting: Adult Health

## 2021-05-13 VITALS — BP 150/80 | HR 64 | Temp 97.8°F | Ht 69.0 in | Wt 133.0 lb

## 2021-05-13 DIAGNOSIS — E7800 Pure hypercholesterolemia, unspecified: Secondary | ICD-10-CM

## 2021-05-13 DIAGNOSIS — G4700 Insomnia, unspecified: Secondary | ICD-10-CM

## 2021-05-13 DIAGNOSIS — N400 Enlarged prostate without lower urinary tract symptoms: Secondary | ICD-10-CM | POA: Diagnosis not present

## 2021-05-13 DIAGNOSIS — Z1159 Encounter for screening for other viral diseases: Secondary | ICD-10-CM

## 2021-05-13 DIAGNOSIS — I10 Essential (primary) hypertension: Secondary | ICD-10-CM | POA: Diagnosis not present

## 2021-05-13 LAB — COMPREHENSIVE METABOLIC PANEL
ALT: 13 U/L (ref 0–53)
AST: 22 U/L (ref 0–37)
Albumin: 4.4 g/dL (ref 3.5–5.2)
Alkaline Phosphatase: 84 U/L (ref 39–117)
BUN: 18 mg/dL (ref 6–23)
CO2: 29 mEq/L (ref 19–32)
Calcium: 9.3 mg/dL (ref 8.4–10.5)
Chloride: 96 mEq/L (ref 96–112)
Creatinine, Ser: 1.01 mg/dL (ref 0.40–1.50)
GFR: 70.69 mL/min (ref >60.00)
Glucose, Bld: 88 mg/dL (ref 70–99)
Potassium: 3.7 mEq/L (ref 3.5–5.1)
Sodium: 135 mEq/L (ref 135–145)
Total Bilirubin: 1 mg/dL (ref 0.2–1.2)
Total Protein: 6.7 g/dL (ref 6.0–8.3)

## 2021-05-13 LAB — CBC WITH DIFFERENTIAL/PLATELET
Basophils Absolute: 0 10*3/uL (ref 0.0–0.1)
Basophils Relative: 0.8 % (ref 0.0–3.0)
Eosinophils Absolute: 0.1 10*3/uL (ref 0.0–0.7)
Eosinophils Relative: 1.8 % (ref 0.0–5.0)
HCT: 41.7 % (ref 39.0–52.0)
Hemoglobin: 14.8 g/dL (ref 13.0–17.0)
Lymphocytes Relative: 12.7 % (ref 12.0–46.0)
Lymphs Abs: 0.7 10*3/uL (ref 0.7–4.0)
MCHC: 35.4 g/dL (ref 30.0–36.0)
MCV: 96.6 fl (ref 78.0–100.0)
Monocytes Absolute: 0.7 10*3/uL (ref 0.1–1.0)
Monocytes Relative: 14.6 % — ABNORMAL HIGH (ref 3.0–12.0)
Neutro Abs: 3.6 10*3/uL (ref 1.4–7.7)
Neutrophils Relative %: 70.1 % (ref 43.0–77.0)
Platelets: 194 10*3/uL (ref 150.0–400.0)
RBC: 4.32 Mil/uL (ref 4.22–5.81)
RDW: 12.9 % (ref 11.5–15.5)
WBC: 5.1 10*3/uL (ref 4.0–10.5)

## 2021-05-13 LAB — LIPID PANEL
Cholesterol: 204 mg/dL — ABNORMAL HIGH (ref 0–200)
HDL: 87.5 mg/dL (ref >39.00)
LDL Cholesterol: 102 mg/dL — ABNORMAL HIGH (ref 0–99)
NonHDL: 116.56
Total CHOL/HDL Ratio: 2
Triglycerides: 75 mg/dL (ref 0.0–149.0)
VLDL: 15 mg/dL (ref 0.0–40.0)

## 2021-05-13 LAB — TSH: TSH: 0.81 u[IU]/mL (ref 0.35–4.50)

## 2021-05-13 LAB — PSA: PSA: 0.44 ng/mL (ref 0.10–4.00)

## 2021-05-13 NOTE — Progress Notes (Signed)
Subjective:    Patient ID: Michael Miranda, male    DOB: [birth year], 80 y.o.   MRN: [id number]  HPI Patient presents for yearly preventative medicine examination. He is a pleasant 80 year old male who  has a past medical history of Alcohol abuse, Arthritis, Depression, Hyperlipidemia, Hypertension, and Kidney stones.  Hypertension -managed by cardiology.  Reports taking losartan 50 mg daily instead of 100 mg daily as prescribed and sometimes skips an entire dose.  He reports of taking a half a tab of losartan 100 mg lessons foggy head note, confusion, chest heaviness, and dizziness.  Takes chlorthalidone 25 mg daily.  He does monitor his blood pressures at home with readings consistently in the 120s over 70s. Cardiology switched him from Valsartan to Losartan to see if this helps with his side effects.   BP Readings from Last 3 Encounters:  05/13/21 (!) 150/80  05/12/21 (!) 156/70  04/07/21 (!) 160/76   Hyperlipidemia -takes Crestor 5 mg daily.  Denies myalgia or fatigue Lab Results  Component Value Date   CHOL 189 05/07/2020   HDL 84.60 05/07/2020   LDLCALC 90 05/07/2020   LDLDIRECT 92.3 10/01/2013   TRIG 73.0 05/07/2020   CHOLHDL 2 05/07/2020   Insomnia - has weaned himself off ambien.    BPH - has nocturia - gets up twice a night. Would like his PSA checked today.   All immunizations and health maintenance protocols were reviewed with the patient and needed orders were placed.  Appropriate screening laboratory values were ordered for the patient including screening of hyperlipidemia, renal function and hepatic function. If indicated by BPH, a PSA was ordered.  Medication reconciliation,  past medical history, social history, problem list and allergies were reviewed in detail with the patient  Goals were established with regard to weight loss, exercise, and  diet in compliance with medications.  He stays active with walking and eats a heart healthy diet  He has no acute  complaints.    Review of Systems  Constitutional: Negative.   HENT: Positive for hearing loss.   Eyes: Negative.   Respiratory: Negative.   Cardiovascular: Negative.   Gastrointestinal: Negative.   Endocrine: Negative.   Genitourinary: Negative.   Musculoskeletal: Negative.   Skin: Negative.   Allergic/Immunologic: Negative.   Neurological: Negative.   Hematological: Negative.   Psychiatric/Behavioral: Negative.   All other systems reviewed and are negative.  Past Medical History:  Diagnosis Date  . Alcohol abuse    10 years ago   . Arthritis   . Depression   . Hyperlipidemia   . Hypertension   . Kidney stones    20 years ago    Social History   Socioeconomic History  . Marital status: Married    Spouse name: Not on file  . Number of children: Not on file  . Years of education: Not on file  . Highest education level: Not on file  Occupational History  . Not on file  Tobacco Use  . Smoking status: Never Smoker  . Smokeless tobacco: Never Used  Substance and Sexual Activity  . Alcohol use: Yes    Alcohol/week: 0.0 standard drinks    Comment: Wine with dinner  . Drug use: No  . Sexual activity: Not on file  Other Topics Concern  . 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    Social Determinants of Health   Financial Resource Strain: Low Risk   . Difficulty of Paying Living Expenses: Not hard at all  Food Insecurity: No Food Insecurity  . Worried About Charity fundraiser in the Last Year: Never true  . Ran Out of Food in the Last Year: Never true  Transportation Needs: No Transportation Needs  . Lack of Transportation (Medical): No  . Lack of Transportation (Non-Medical): No  Physical Activity: Insufficiently Active  . Days of Exercise per Week: 4 days  . Minutes of Exercise per  Session: 20 min  Stress: No Stress Concern Present  . Feeling of Stress : Not at all  Social Connections: Moderately Isolated  . Frequency of Communication with Friends and Family: Twice a week  . Frequency of Social Gatherings with Friends and Family: Never  . Attends Religious Services: 1 to 4 times per year  . Active Member of Clubs or Organizations: No  . Attends Archivist Meetings: Never  . Marital Status: Married  Human resources officer Violence: Not At Risk  . Fear of Current or Ex-Partner: No  . Emotionally Abused: No  . Physically Abused: No  . Sexually Abused: No    Past Surgical History:  Procedure Laterality Date  . COCHLEAR IMPLANT     bilateral  . HERNIA REPAIR      Family History  Problem Relation Age of Onset  . Heart attack Brother   . Coronary artery disease Brother   . Cancer Father        lung  . Dementia Mother     No Known Allergies  Current Outpatient Medications on File Prior to Visit  Medication Sig Dispense Refill  . chlorthalidone (HYGROTON) 25 MG tablet Take 1 tablet (25 mg total) by mouth daily. 90 tablet 3  . valsartan (DIOVAN) 160 MG tablet Take 1 tablet (160 mg total) by mouth daily. 30 tablet 5  . rosuvastatin (CRESTOR) 5 MG tablet TAKE 1 TABLET(5 MG) BY MOUTH DAILY 90 tablet 0   No current facility-administered medications on file prior to visit.    BP (!) 150/80   Pulse 64   Temp 97.8 F (36.6 C) (Oral)   Ht 5\' 9"  (1.753 m)   Wt 133 lb (60.3 kg)   SpO2 97%   BMI 19.64 kg/m       Objective:   Physical Exam Vitals and nursing note reviewed.  Constitutional:      General: He is not in acute distress.    Appearance: Normal appearance. He is well-developed and normal weight.  HENT:     Head: Normocephalic and atraumatic.     Comments: Cochlear implants noted     Right Ear: Tympanic membrane, ear canal and external ear normal. There is no impacted cerumen.     Left Ear: Tympanic membrane, ear canal and external ear  normal. There is no impacted cerumen.     Nose: Nose normal. No congestion or rhinorrhea.     Mouth/Throat:     Mouth: Mucous membranes are moist.     Pharynx: Oropharynx is clear. No oropharyngeal exudate or posterior oropharyngeal erythema.  Eyes:     General:        Right eye: No discharge.        Left eye: No discharge.     Extraocular Movements: Extraocular movements intact.     Conjunctiva/sclera: Conjunctivae  normal.     Pupils: Pupils are equal, round, and reactive to light.  Neck:     Vascular: No carotid bruit.     Trachea: No tracheal deviation.  Cardiovascular:     Rate and Rhythm: Normal rate and regular rhythm.     Pulses: Normal pulses.     Heart sounds: Normal heart sounds. No murmur heard. No friction rub. No gallop.   Pulmonary:     Effort: Pulmonary effort is normal. No respiratory distress.     Breath sounds: Normal breath sounds. No stridor. No wheezing, rhonchi or rales.  Chest:     Chest wall: No tenderness.  Abdominal:     General: Bowel sounds are normal. There is no distension.     Palpations: Abdomen is soft. There is no mass.     Tenderness: There is no abdominal tenderness. There is no right CVA tenderness, left CVA tenderness, guarding or rebound.     Hernia: No hernia is present.  Musculoskeletal:        General: No swelling, tenderness, deformity or signs of injury. Normal range of motion.     Right lower leg: No edema.     Left lower leg: No edema.  Lymphadenopathy:     Cervical: No cervical adenopathy.  Skin:    General: Skin is warm and dry.     Capillary Refill: Capillary refill takes less than 2 seconds.     Coloration: Skin is not jaundiced or pale.     Findings: No bruising, erythema, lesion or rash.  Neurological:     General: No focal deficit present.     Mental Status: He is alert and oriented to person, place, and time.     Cranial Nerves: No cranial nerve deficit.     Sensory: No sensory deficit.     Motor: No weakness.      Coordination: Coordination normal.     Gait: Gait normal.     Deep Tendon Reflexes: Reflexes normal.  Psychiatric:        Mood and Affect: Mood normal.        Behavior: Behavior normal.        Thought Content: Thought content normal.        Judgment: Judgment normal.       Assessment & Plan:  1. Essential hypertension - Better controlled at home  - Continue follow up with Cardiology as directed  - CBC with Differential/Platelet; Future - Comprehensive metabolic panel; Future - Lipid panel; Future - TSH; Future  2. Hypercholesteremia - Consider dose change  - CBC with Differential/Platelet; Future - Comprehensive metabolic panel; Future - Lipid panel; Future - TSH; Future  3. Insomnia, unspecified type - No longer an issue  4. Benign prostatic hyperplasia without lower urinary tract symptoms  - PSA; Future  5. Need for hepatitis C screening test  - Hep C Antibody; Future  Dorothyann Peng, NP

## 2021-05-13 NOTE — Patient Instructions (Signed)
It was great seeing you today   Your exam was great.   We will follow up with you regarding your blood work   I will see you back in one year or sooner if needed

## 2021-05-14 LAB — HEPATITIS C ANTIBODY
Hepatitis C Ab: NONREACTIVE
SIGNAL TO CUT-OFF: 0.01 (ref <1.00)

## 2021-06-21 NOTE — Progress Notes (Signed)
Patient ID: Michael Miranda                 DOB: 07/06/1941                      MRN: [id number]     HPI: Michael Miranda is a 80 y.o. male referred by Dr. Burt Knack to HTN clinic. PMH is significant for HTN, HLD, depression, hx of alcohol abuse. Last seen in HTN clinic on 05/12/21 and BP was 156/70, however average of home readings was at goal. Reported taking losartan 50 mg daily instead of 100 mg as prescribed and sometimes skips entire dose due to feeling foggy-headedness, confusion, chest heaviness, and dizziness. Has reported foggy-headedness with multiple other medications. Reported adherence to chlorthalidone as prescribed. He was willing to try another ARB, so losartan was discontinued and started valsartan 160 mg daily. At visit with PCP on 05/13/21, BP was 150/80, no medication changes made. Completed lab work at that time which showed stable serum creatinine and electrolytes wnl.   Today, patient reports he is doing well lately and his blood pressure has improved since starting valsartan. He does endorse some nausea since starting valsartan but that it has improved over time and he expects it will continue to improve the longer he takes it. He reports some mental confusion but notes that it could be from any number of things and not necessarily his medications. Denies dizziness, headaches, blurred vision, swelling. Still exercising with a daily brisk walk. He takes his medications daily when he wakes up around 6am and then checks his blood pressure 1-2 hours later. Home BP readings range from 110/61 to 134/77, with an average of 122/70.   Current HTN meds: Valsartan 160 mg daily (AM), chlorthalidone 25 mg daily (AM) Previously tried: amlodipine 2.5, 5 mg daily (brain fog), lisinopril 20 mg daily ("foggy headed"), losartan 50, 100 mg daily ("foggy headed") BP goal: <130/80 mmHg   Family History: heart attack (brother), coronary artery disease (brother), dementia (mother), lung cancer (father)     Social History: denies tobacco abuse; hx of alcohol abuse  Diet:  Breakfast - All brands cereal Lunch - tuna fish sandwich, vegetable soup, scrambled egg Dinner  - chicken breast with vegetables, spaghetti Limits fried food, red meat every other 3rd day Drink - 1 cup of coffee, hot tea, glass a wine/day   Exercise: brisk walk 25 mins (5-10 blocks) 5 days a week   Home BP readings: 122/71, 110/61, 134/77, 128/77, 119/70, 122/71, 110/61, 115/67, 117/68, 133/76, 131/73 (average 122/70)  Labs:  05/13/21: Scr 1.01, K 3.7 11/18/20: Scr 1.02, K 3.8  Wt Readings from Last 3 Encounters:  05/13/21 133 lb (60.3 kg)  09/29/20 130 lb 9.6 oz (59.2 kg)  07/18/20 128 lb 9.6 oz (58.3 kg)   BP Readings from Last 3 Encounters:  05/13/21 (!) 150/80  05/12/21 (!) 156/70  04/07/21 (!) 160/76   Pulse Readings from Last 3 Encounters:  05/13/21 64  05/12/21 61  04/07/21 60    Renal function: CrCl cannot be calculated (Patient's most recent lab result is older than the maximum 21 days allowed.).  Past Medical History:  Diagnosis Date   Alcohol abuse    10 years ago    Arthritis    Depression    Hyperlipidemia    Hypertension    Kidney stones    20 years ago    Current Outpatient Medications on File Prior to Visit  Medication Sig Dispense Refill  chlorthalidone (HYGROTON) 25 MG tablet Take 1 tablet (25 mg total) by mouth daily. 90 tablet 3   rosuvastatin (CRESTOR) 5 MG tablet TAKE 1 TABLET(5 MG) BY MOUTH DAILY 90 tablet 0   valsartan (DIOVAN) 160 MG tablet Take 1 tablet (160 mg total) by mouth daily. 30 tablet 5   No current facility-administered medications on file prior to visit.   No Known Allergies  Assessment/Plan:  1. Hypertension - Blood pressure of 136/70 in clinic today is nearly at goal <130/80 mmHg but is much improved from previous systolic readings in 268T after switching from losartan to valsartan at last visit. Average of home readings is at goal. He appears to be  tolerating valsartan well and has no complaints other than some mild nausea that has improved over time. Will continue valsartan 160 mg daily and chlorthalidone 25 mg daily. Encouraged him to continue exercising and limiting sodium intake. Given significant improvement in blood pressure and patient satisfaction with this new medication regimen, will have patient follow up next with Dr. Burt Knack for his yearly visit, which he knows to call and schedule (due for visit in October). He will call our office if his blood pressure rises or if he has any concerns prior to this visit with Dr. Burt Knack.   Rebbeca Paul, PharmD PGY2 Ambulatory Care Pharmacy Resident 06/23/2021 10:07 AM

## 2021-06-23 ENCOUNTER — Other Ambulatory Visit: Payer: Self-pay

## 2021-06-23 ENCOUNTER — Ambulatory Visit (INDEPENDENT_AMBULATORY_CARE_PROVIDER_SITE_OTHER): Payer: Medicare Other | Admitting: Student-PharmD

## 2021-06-23 VITALS — BP 136/70 | HR 60

## 2021-06-23 DIAGNOSIS — I10 Essential (primary) hypertension: Secondary | ICD-10-CM | POA: Diagnosis not present

## 2021-06-23 NOTE — Patient Instructions (Signed)
It was nice to see you today!  Schedule a 1 year follow up visit with Dr. Burt Knack (due for visit in October 2022).   Your goal blood pressure is less than 130/80 mmHg. In clinic, your blood pressure was 136/70 mmHg.  Medication Changes: Continue valsartan 160 mg daily and chlorthalidone 25 mg daily  Monitor blood pressure at home daily and keep a log (on your phone or piece of paper) to bring with you to your next visit. Write down date, time, blood pressure and pulse.  Keep up the good work with diet and exercise. Aim for a diet full of vegetables, fruit and lean meats (chicken, [place], fish). Try to limit salt intake by eating fresh or frozen vegetables (instead of canned), rinse canned vegetables prior to cooking and do not add any additional salt to meals.

## 2021-10-01 DIAGNOSIS — H903 Sensorineural hearing loss, bilateral: Secondary | ICD-10-CM | POA: Diagnosis not present

## 2021-10-16 DIAGNOSIS — H903 Sensorineural hearing loss, bilateral: Secondary | ICD-10-CM | POA: Diagnosis not present

## 2021-11-25 ENCOUNTER — Telehealth: Payer: Self-pay | Admitting: Adult Health

## 2021-11-25 NOTE — Telephone Encounter (Signed)
Patient calling in with respiratory symptoms: Shortness of breath, chest pain, palpitations or other red words send to Triage  Does the patient have a fever over 100, cough, congestion, sore throat, runny nose, lost of taste/smell (please list symptoms that patient has)? Sore throat, cough, fatigue, hand tremors, stiff feeling in legs  What date did symptoms start?could've started a couple days before only noticed this morning (If over 5 days ago, pt may be scheduled for in person visit)  Have you tested for Covid in the last 5 days? No   If yes, was it positive OR negative ? If positive in the last 5 days, please schedule virtual visit now. If negative, schedule for an in person OV with the next available provider if PCP has no openings. Please also let patient know they will be tested again (follow the script below)  "you will have to arrive 46mins prior to your appt time to be Covid tested. Please park in back of office at the cone & call [phone] to let the staff know you have arrived. A staff member will meet you at your car to do a rapid covid test. Once the test has resulted you will be notified by phone of your results to determine if appt will remain an in person visit or be converted to a virtual/phone visit. If you arrive less than 76mins before your appt time, your visit will be automatically converted to virtual & any recommended testing will happen AFTER the visit."   [facility]  If no availability for virtual visit in office,  please schedule another [place] office  If no availability at another [place] office, please instruct patient that they can schedule an evisit or virtual visit through their mychart account. Visits up to 8pm  patients can be seen in office 5 days after positive COVID test

## 2021-11-26 ENCOUNTER — Ambulatory Visit (INDEPENDENT_AMBULATORY_CARE_PROVIDER_SITE_OTHER): Payer: Medicare Other | Admitting: Adult Health

## 2021-11-26 ENCOUNTER — Ambulatory Visit (INDEPENDENT_AMBULATORY_CARE_PROVIDER_SITE_OTHER): Payer: Medicare Other

## 2021-11-26 ENCOUNTER — Emergency Department (HOSPITAL_BASED_OUTPATIENT_CLINIC_OR_DEPARTMENT_OTHER)
Admission: EM | Admit: 2021-11-26 | Discharge: 2021-11-26 | Disposition: A | Discharge status: Home / Self Care | Payer: Medicare Other | Attending: Emergency Medicine | Admitting: Emergency Medicine

## 2021-11-26 ENCOUNTER — Other Ambulatory Visit: Payer: Self-pay

## 2021-11-26 ENCOUNTER — Ambulatory Visit: Payer: Medicare Other | Admitting: Adult Health

## 2021-11-26 ENCOUNTER — Encounter (HOSPITAL_BASED_OUTPATIENT_CLINIC_OR_DEPARTMENT_OTHER): Payer: Self-pay

## 2021-11-26 VITALS — BP 122/64 | HR 76 | Temp 98.4°F | Wt 138.0 lb

## 2021-11-26 DIAGNOSIS — R251 Tremor, unspecified: Secondary | ICD-10-CM

## 2021-11-26 DIAGNOSIS — R0602 Shortness of breath: Secondary | ICD-10-CM | POA: Insufficient documentation

## 2021-11-26 DIAGNOSIS — R059 Cough, unspecified: Secondary | ICD-10-CM

## 2021-11-26 DIAGNOSIS — I10 Essential (primary) hypertension: Secondary | ICD-10-CM | POA: Insufficient documentation

## 2021-11-26 DIAGNOSIS — M791 Myalgia, unspecified site: Secondary | ICD-10-CM | POA: Insufficient documentation

## 2021-11-26 DIAGNOSIS — J029 Acute pharyngitis, unspecified: Secondary | ICD-10-CM | POA: Insufficient documentation

## 2021-11-26 DIAGNOSIS — J069 Acute upper respiratory infection, unspecified: Secondary | ICD-10-CM | POA: Diagnosis not present

## 2021-11-26 DIAGNOSIS — E871 Hypo-osmolality and hyponatremia: Secondary | ICD-10-CM

## 2021-11-26 DIAGNOSIS — Z20822 Contact with and (suspected) exposure to covid-19: Secondary | ICD-10-CM | POA: Insufficient documentation

## 2021-11-26 DIAGNOSIS — E538 Deficiency of other specified B group vitamins: Secondary | ICD-10-CM

## 2021-11-26 LAB — BASIC METABOLIC PANEL
BUN: 18 mg/dL (ref 6–23)
CO2: 32 mEq/L (ref 19–32)
Calcium: 9.5 mg/dL (ref 8.4–10.5)
Chloride: 90 mEq/L — ABNORMAL LOW (ref 96–112)
Creatinine, Ser: 1.01 mg/dL (ref 0.40–1.50)
GFR: 70.43 mL/min (ref >60.00)
Glucose, Bld: 94 mg/dL (ref 70–99)
Potassium: 4.1 mEq/L (ref 3.5–5.1)
Sodium: 127 mEq/L — ABNORMAL LOW (ref 135–145)

## 2021-11-26 LAB — URINALYSIS, ROUTINE W REFLEX MICROSCOPIC
Bilirubin Urine: NEGATIVE
Glucose, UA: NEGATIVE mg/dL
Leukocytes,Ua: NEGATIVE
Nitrite: NEGATIVE
Protein, ur: NEGATIVE mg/dL
Specific Gravity, Urine: 1.005 (ref 1.005–1.030)
pH: 6.5 (ref 5.0–8.0)

## 2021-11-26 LAB — RESP PANEL BY RT-PCR (FLU A&B, COVID) ARPGX2
Influenza A by PCR: NEGATIVE
Influenza B by PCR: NEGATIVE
SARS Coronavirus 2 by RT PCR: NEGATIVE

## 2021-11-26 LAB — CBC WITH DIFFERENTIAL/PLATELET
Basophils Absolute: 0 10*3/uL (ref 0.0–0.1)
Basophils Relative: 0.3 % (ref 0.0–3.0)
Eosinophils Absolute: 0 10*3/uL (ref 0.0–0.7)
Eosinophils Relative: 0.1 % (ref 0.0–5.0)
HCT: 37.7 % — ABNORMAL LOW (ref 39.0–52.0)
Hemoglobin: 13.2 g/dL (ref 13.0–17.0)
Lymphocytes Relative: 6.2 % — ABNORMAL LOW (ref 12.0–46.0)
Lymphs Abs: 0.7 10*3/uL (ref 0.7–4.0)
MCHC: 35.1 g/dL (ref 30.0–36.0)
MCV: 98.8 fl (ref 78.0–100.0)
Monocytes Absolute: 0.8 10*3/uL (ref 0.1–1.0)
Monocytes Relative: 6.7 % (ref 3.0–12.0)
Neutro Abs: 10 10*3/uL — ABNORMAL HIGH (ref 1.4–7.7)
Neutrophils Relative %: 86.7 % — ABNORMAL HIGH (ref 43.0–77.0)
Platelets: 172 10*3/uL (ref 150.0–400.0)
RBC: 3.82 Mil/uL — ABNORMAL LOW (ref 4.22–5.81)
RDW: 12.7 % (ref 11.5–15.5)
WBC: 11.5 10*3/uL — ABNORMAL HIGH (ref 4.0–10.5)

## 2021-11-26 LAB — POCT RAPID STREP A (OFFICE): Rapid Strep A Screen: NEGATIVE

## 2021-11-26 LAB — C-REACTIVE PROTEIN: CRP: 12.4 mg/dL (ref 0.5–20.0)

## 2021-11-26 LAB — VITAMIN B12: Vitamin B-12: 489 pg/mL (ref 211–911)

## 2021-11-26 LAB — POC INFLUENZA A&B (BINAX/QUICKVUE)
Influenza A, POC: NEGATIVE
Influenza B, POC: NEGATIVE

## 2021-11-26 LAB — TSH: TSH: 1.19 u[IU]/mL (ref 0.35–5.50)

## 2021-11-26 LAB — POC COVID19 BINAXNOW: SARS Coronavirus 2 Ag: NEGATIVE

## 2021-11-26 MED ORDER — GUAIFENESIN-DM 100-10 MG/5ML PO SYRP: 15.0000 mL | ORAL_SOLUTION | Freq: Once | ORAL | Status: DC

## 2021-11-26 MED ORDER — SODIUM CHLORIDE 0.9 % IV BOLUS
1000.0000 mL | Freq: Once | INTRAVENOUS | Status: AC
  Administered 2021-11-26: 19:00:00 1000 mL via INTRAVENOUS

## 2021-11-26 MED ORDER — MAGIC MOUTHWASH W/LIDOCAINE
5.0000 mL | Freq: Three times a day (TID) | ORAL | 0 refills | Status: DC | PRN
Start: 2021-11-26 — End: 2022-05-11

## 2021-11-26 MED ORDER — GUAIFENESIN-CODEINE 100-10 MG/5ML PO SOLN
10.0000 mL | Freq: Once | ORAL | Status: AC
  Administered 2021-11-26: 20:00:00 10 mL via ORAL
  Filled 2021-11-26: qty 10

## 2021-11-26 NOTE — ED Provider Notes (Signed)
[facility] EMERGENCY DEPT Provider Note   CSN: [id number] Arrival date & time: 11/26/21  1749     History Chief Complaint  Patient presents with   Shortness of Breath    Michael Miranda is a 80 y.o. male.  Patient c/o sore throat, cough, body aches, for past two days. Symptoms acute onset, moderate, constant, persistent. No specific known covid or flu exposure. No trouble swallowing. Mild sob w coughing spell. No chest pain or discomfort. No pleuritic pain. No abd pain or nvd. No dysuria or gu c/o. No extremity swelling or pain. Went to urgent care, states had tests done and was told to go to ER.    The history is provided by the patient, the spouse and medical records.  Shortness of Breath Associated symptoms: cough, fever and sore throat   Associated symptoms: no abdominal pain, no chest pain, no headaches, no neck pain, no rash and no vomiting       Past Medical History:  Diagnosis Date   Alcohol abuse    10 years ago    Arthritis    Depression    Hyperlipidemia    Hypertension    Kidney stones    20 years ago    Patient Active Problem List   Diagnosis Date Noted   Rash and nonspecific skin eruption 11/07/2018   TB lung, latent 10/24/2018   Medication monitoring encounter 10/24/2018   Essential hypertension 09/18/2012   Hypercholesteremia 12/27/2011   Insomnia 12/27/2011    Past Surgical History:  Procedure Laterality Date   COCHLEAR IMPLANT     bilateral   HERNIA REPAIR         Family History  Problem Relation Age of Onset   Heart attack Brother    Coronary artery disease Brother    Cancer Father        lung   Dementia Mother     Social History   Tobacco Use   Smoking status: Never   Smokeless tobacco: Never  Vaping Use   Vaping Use: Never used  Substance Use Topics   Alcohol use: Yes    Alcohol/week: 0.0 standard drinks    Comment: Wine with dinner   Drug use: No    Home Medications Prior to Admission medications    Medication Sig Start Date End Date Taking? Authorizing Provider  chlorthalidone (HYGROTON) 25 MG tablet Take 1 tablet (25 mg total) by mouth daily. 05/12/21   Sherren Mocha, MD  magic mouthwash w/lidocaine SOLN Take 5 mLs by mouth 3 (three) times daily as needed. Gargle and spit 11/26/21   Nafziger, Tommi Rumps, NP  rosuvastatin (CRESTOR) 5 MG tablet TAKE 1 TABLET(5 MG) BY MOUTH DAILY 03/04/21   Nafziger, Tommi Rumps, NP  valsartan (DIOVAN) 160 MG tablet Take 1 tablet (160 mg total) by mouth daily. 05/12/21   Sherren Mocha, MD    Allergies    Patient has no known allergies.  Review of Systems   Review of Systems  Constitutional:  Positive for fever.  HENT:  Positive for congestion, rhinorrhea and sore throat. Negative for trouble swallowing.   Eyes:  Negative for redness.  Respiratory:  Positive for cough and shortness of breath.   Cardiovascular:  Negative for chest pain and leg swelling.  Gastrointestinal:  Negative for abdominal pain, diarrhea and vomiting.  Genitourinary:  Negative for dysuria and flank pain.  Musculoskeletal:  Negative for back pain, neck pain and neck stiffness.  Skin:  Negative for rash.  Neurological:  Negative for headaches.  Hematological:  Does not bruise/bleed easily.  Psychiatric/Behavioral:  Negative for confusion.    Physical Exam Updated Vital Signs BP (!) 141/75 (BP Location: Right Arm)    Pulse 69    Temp 98.5 F (36.9 C) (Oral)    Resp 12    Wt 62 kg    SpO2 100%    BMI 20.18 kg/m   Physical Exam Vitals and nursing note reviewed.  Constitutional:      Appearance: Normal appearance. He is well-developed.  HENT:     Head: Atraumatic.     Right Ear: Tympanic membrane normal.     Left Ear: Tympanic membrane normal.     Nose: Congestion present.     Mouth/Throat:     Mouth: Mucous membranes are moist.     Pharynx: Oropharynx is clear. Posterior oropharyngeal erythema present. No oropharyngeal exudate.     Comments: No asymmetric swelling or abscess.   Eyes:     General: No scleral icterus.    Conjunctiva/sclera: Conjunctivae normal.     Pupils: Pupils are equal, round, and reactive to light.  Neck:     Trachea: No tracheal deviation.     Comments: No stiffness or rigidity.  Cardiovascular:     Rate and Rhythm: Normal rate and regular rhythm.     Pulses: Normal pulses.     Heart sounds: Normal heart sounds. No murmur heard.   No friction rub. No gallop.  Pulmonary:     Effort: Pulmonary effort is normal. No accessory muscle usage or respiratory distress.     Breath sounds: Normal breath sounds.  Abdominal:     General: Bowel sounds are normal. There is no distension.     Palpations: Abdomen is soft.     Tenderness: There is no abdominal tenderness. There is no guarding.  Genitourinary:    Comments: No cva tenderness. Musculoskeletal:        General: No swelling or tenderness.     Cervical back: Normal range of motion and neck supple. No rigidity.     Right lower leg: No edema.     Left lower leg: No edema.  Lymphadenopathy:     Cervical: No cervical adenopathy.  Skin:    General: Skin is warm and dry.     Findings: No rash.  Neurological:     Mental Status: He is alert.     Comments: Alert, speech clear.   Psychiatric:        Mood and Affect: Mood normal.    ED Results / Procedures / Treatments   Labs (all labs ordered are listed, but only abnormal results are displayed) Results for orders placed or performed in visit on 11/26/21  C-reactive Protein  Result Value Ref Range   CRP 12.4 0.5 - 20.0 mg/dL  TSH  Result Value Ref Range   TSH 1.19 0.35 - 5.50 uIU/mL  Vitamin B12  Result Value Ref Range   Vitamin B-12 489 211 - 911 pg/mL  Basic Metabolic Panel  Result Value Ref Range   Sodium 127 (L) 135 - 145 mEq/L   Potassium 4.1 3.5 - 5.1 mEq/L   Chloride 90 (L) 96 - 112 mEq/L   CO2 32 19 - 32 mEq/L   Glucose, Bld 94 70 - 99 mg/dL   BUN 18 6 - 23 mg/dL   Creatinine, Ser 1.01 0.40 - 1.50 mg/dL   GFR 70.43  >60.00 mL/min   Calcium 9.5 8.4 - 10.5 mg/dL  CBC with Differential/Platelet  Result Value Ref  Range   WBC 11.5 (H) 4.0 - 10.5 K/uL   RBC 3.82 (L) 4.22 - 5.81 Mil/uL   Hemoglobin 13.2 13.0 - 17.0 g/dL   HCT 37.7 (L) 39.0 - 52.0 %   MCV 98.8 78.0 - 100.0 fl   MCHC 35.1 30.0 - 36.0 g/dL   RDW 12.7 11.5 - 15.5 %   Platelets 172.0 150.0 - 400.0 K/uL   Neutrophils Relative % 86.7 Repeated and verified X2. (H) 43.0 - 77.0 %   Lymphocytes Relative 6.2 (L) 12.0 - 46.0 %   Monocytes Relative 6.7 3.0 - 12.0 %   Eosinophils Relative 0.1 0.0 - 5.0 %   Basophils Relative 0.3 0.0 - 3.0 %   Neutro Abs 10.0 (H) 1.4 - 7.7 K/uL   Lymphs Abs 0.7 0.7 - 4.0 K/uL   Monocytes Absolute 0.8 0.1 - 1.0 K/uL   Eosinophils Absolute 0.0 0.0 - 0.7 K/uL   Basophils Absolute 0.0 0.0 - 0.1 K/uL  POC Influenza A&B(BINAX/QUICKVUE)  Result Value Ref Range   Influenza A, POC Negative Negative   Influenza B, POC Negative Negative  POC COVID-19  Result Value Ref Range   SARS Coronavirus 2 Ag Negative Negative  POCT rapid strep A  Result Value Ref Range   Rapid Strep A Screen Negative Negative   DG Chest 2 View  Result Date: 11/26/2021 CLINICAL DATA:  Cough for 2 weeks EXAM: CHEST - 2 VIEW COMPARISON:  08/15/2018.  Chest CT of 09/01/2018. FINDINGS: Mild hyperinflation. Convex right thoracic spine curvature is mild. Midline trachea. Normal heart size and mediastinal contours. No pleural effusion or pneumothorax. Mild biapical pleuroparenchymal scarring. No lobar consolidation. Prominent anterior first ribs, similar. IMPRESSION: No acute cardiopulmonary disease. Electronically Signed   By: Abigail Miyamoto M.D.   On: 11/26/2021 16:19    EKG None  Radiology DG Chest 2 View  Result Date: 11/26/2021 CLINICAL DATA:  Cough for 2 weeks EXAM: CHEST - 2 VIEW COMPARISON:  08/15/2018.  Chest CT of 09/01/2018. FINDINGS: Mild hyperinflation. Convex right thoracic spine curvature is mild. Midline trachea. Normal heart size and  mediastinal contours. No pleural effusion or pneumothorax. Mild biapical pleuroparenchymal scarring. No lobar consolidation. Prominent anterior first ribs, similar. IMPRESSION: No acute cardiopulmonary disease. Electronically Signed   By: Abigail Miyamoto M.D.   On: 11/26/2021 16:19    Procedures Procedures   Medications Ordered in ED Medications  sodium chloride 0.9 % bolus 1,000 mL (has no administration in time range)    ED Course  I have reviewed the triage vital signs and the nursing notes.  Pertinent labs & imaging results that were available during my care of the patient were reviewed by me and considered in my medical decision making (see chart for details).    MDM Rules/Calculators/A&P                         Labs ordered. Patient had imaging earlier.  Reviewed nursing notes and prior charts for additional history.   Labs reviewed/interpreted by me - covid/flu neg. Na mildly low.   NS bolus.   CXR reviewed/interpreted by me - no pna.   Po fluids/food. Additional ivf.  Pt currently appears stable for d/c.   Rec pcp f/u.  Return precautions provided.      Final Clinical Impression(s) / ED Diagnoses Final diagnoses:  None    Rx / DC Orders ED Discharge Orders     None        Himani Corona,  Lennette Bihari, MD 11/26/21 [phone]

## 2021-11-26 NOTE — Discharge Instructions (Addendum)
It was our pleasure to provide your ER care today - we hope that you feel better.  Drink plenty of fluids/stay well hydrated. Take acetaminophen or ibuprofen as need. May take over the counter cold/flu medication as need for symptom relief.   From earlier labs, your sodium level is mildly low (127) - stay hydrated, try Gatorade or other electrolyte/fluids, and follow up with primary care doctor in one week for recheck.  Follow up closely with your doctor.   Return to ER if worse, new symptoms, new or severe pain, chest pain, trouble breathing, new/severe pain, abdominal pain, persistent vomiting, weak/fainting, or other concern.

## 2021-11-26 NOTE — ED Notes (Signed)
Pt given cup of water and peanut butter crackers

## 2021-11-26 NOTE — ED Triage Notes (Signed)
Pt was seen at PCP today with sore throat, generalized weakness, cough. Pt was sent here this evening, due to pt having palpitations, elevated white count, and low sodium, ShOB per pt's wife.

## 2021-11-26 NOTE — ED Notes (Signed)
Pt assisted to side of bed to use urinal 

## 2021-11-26 NOTE — Progress Notes (Signed)
Subjective:    Patient ID: Michael Miranda, male    DOB: 20-Sep-1941, 80 y.o.   MRN: [id number]  HPI  80 year old male who  has a past medical history of Alcohol abuse, Arthritis, Depression, Hyperlipidemia, Hypertension, and Kidney stones.  He presents to the office today with his wife   Over the last two weeks he has developed a sore throat where is feels like he is swallowing glass cough with clear mucus.  He denies fevers or chills, does have fatigue.  Yesterday he developed a hoarse voice but this seems to be improved today  Additionally, he reports that over the last week he has developed upper extremity tremor that seems to be more apparent at rest.  Feels as though the tremor has gotten worse over the last couple of days.  His wife reports that looking back she does believe she noticed upper extremity tremors from time to time but is unsure when these started.  Has not noticed any difficulty with feeding himself or using his hands.  Shaded symptoms over the last week include stiffness in bilateral legs, this seems to be improving though.  Review of Systems See HPI   Past Medical History:  Diagnosis Date   Alcohol abuse    10 years ago    Arthritis    Depression    Hyperlipidemia    Hypertension    Kidney stones    20 years ago    Social History   Socioeconomic History   Marital status: Married    Spouse name: Not on file   Number of children: Not on file   Years of education: Not on file   Highest education level: Not on file  Occupational History   Not on file  Tobacco Use   Smoking status: Never   Smokeless tobacco: Never  Substance and Sexual Activity   Alcohol use: Yes    Alcohol/week: 0.0 standard drinks    Comment: Wine with dinner   Drug use: No   Sexual activity: Not on file  Other Topics Concern   Not on file  Social History Narrative   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    Social Determinants of Health   Financial Resource Strain: Low Risk    Difficulty of Paying Living Expenses: Not hard at all  Food Insecurity: No Food Insecurity   Worried About Charity fundraiser in the Last Year: Never true   [place] in the Last Year: Never true  Transportation Needs: No Transportation Needs   Lack of Transportation (Medical): No   Lack of Transportation (Non-Medical): No  Physical Activity: Insufficiently Active   Days of Exercise per Week: 4 days   Minutes of Exercise per Session: 20 min  Stress: No Stress Concern Present   Feeling of Stress : Not at all  Social Connections: Moderately Isolated   Frequency of Communication with Friends and Family: Twice a week   Frequency of Social Gatherings with Friends and Family: Never   Attends Religious Services: 1 to 4 times per year   Active Member of [employer] or Organizations: No   Attends Archivist Meetings: Never   Marital Status: Married  Human resources officer Violence: Not At Risk   Fear of Current or Ex-Partner: No  Emotionally Abused: No   Physically Abused: No   Sexually Abused: No    Past Surgical History:  Procedure Laterality Date   COCHLEAR IMPLANT     bilateral   HERNIA REPAIR      Family History  Problem Relation Age of Onset   Heart attack Brother    Coronary artery disease Brother    Cancer Father        lung   Dementia Mother     No Known Allergies  Current Outpatient Medications on File Prior to Visit  Medication Sig Dispense Refill   chlorthalidone (HYGROTON) 25 MG tablet Take 1 tablet (25 mg total) by mouth daily. 90 tablet 3   rosuvastatin (CRESTOR) 5 MG tablet TAKE 1 TABLET(5 MG) BY MOUTH DAILY 90 tablet 0   valsartan (DIOVAN) 160 MG tablet Take 1 tablet (160 mg total) by mouth daily. 30 tablet 5   No current facility-administered medications on file prior to visit.    BP 122/64 (BP Location: Left  Arm, Patient Position: Sitting, Cuff Size: Normal)    Pulse 76    Temp 98.4 F (36.9 C) (Oral)    Wt 138 lb (62.6 kg)    SpO2 95%    BMI 20.38 kg/m       Objective:   Physical Exam Vitals and nursing note reviewed.  Constitutional:      Appearance: Normal appearance.  HENT:     Mouth/Throat:     Tongue: No lesions. Tongue does not deviate from midline.     Tonsils: No tonsillar exudate or tonsillar abscesses.     Comments: Has a white coating on the back of his throat. Cardiovascular:     Rate and Rhythm: Normal rate and regular rhythm.     Pulses: Normal pulses.     Heart sounds: Normal heart sounds.  Pulmonary:     Effort: Pulmonary effort is normal.     Breath sounds: Normal breath sounds.  Musculoskeletal:        General: Normal range of motion.  Skin:    General: Skin is warm and dry.     Capillary Refill: Capillary refill takes less than 2 seconds.  Neurological:     General: No focal deficit present.     Mental Status: He is alert and oriented to person, place, and time.     Motor: Tremor present.     Coordination: Coordination normal.     Gait: Gait is intact.     Comments: Intermittent bilateral upper extremity resting tremor.  No apparent cogwheel rigidity or pill-rolling noticed.  Has 5 out of 5 strength in both upper and lower extremities.  Psychiatric:        Mood and Affect: Mood normal.        Behavior: Behavior normal.        Thought Content: Thought content normal.       Assessment & Plan:  1. Cough, unspecified type  - POC Influenza A&B(BINAX/QUICKVUE)- negative - POC COVID-19- negative  - CBC with Differential/Platelet; Future - Basic Metabolic Panel; Future - Vitamin B12; Future - TSH; Future - C-reactive Protein; Future - DG Chest 2 View; Future - C-reactive Protein - TSH - Vitamin O53 - Basic Metabolic Panel - CBC with Differential/Platelet  2. Sore throat -From thrush.  Will provide Magic mouthwash with lidocaine and nystatin.   Follow-up as needed - POCT rapid strep A- Negative - magic mouthwash w/lidocaine SOLN; Take 5 mLs by mouth 3 (three) times daily as needed.  Gargle and spit  Dispense: 180 mL; Refill: 0 - CBC with Differential/Platelet; Future - Basic Metabolic Panel; Future - Vitamin B12; Future - TSH; Future - C-reactive Protein; Future - C-reactive Protein - TSH - Vitamin K09 - Basic Metabolic Panel - CBC with Differential/Platelet  3. Tremor -Unknown cause, will check labs today.  Will consider referral to neurology. - CBC with Differential/Platelet; Future - Basic Metabolic Panel; Future - Vitamin B12; Future - TSH; Future - C-reactive Protein; Future - C-reactive Protein - TSH - Vitamin F81 - Basic Metabolic Panel - CBC with Differential/Platelet  4. B12 deficiency  - Vitamin B12; Future - Vitamin B12  Dorothyann Peng, NP

## 2021-11-26 NOTE — ED Triage Notes (Addendum)
Pt was seen at PCP today with sore throat, generalized weakness, cough. Pt was sent here this evening, due to pt having palpitations, elevated white count, and low sodium, ShOB per pt's wife.

## 2021-12-01 ENCOUNTER — Telehealth: Payer: Self-pay | Admitting: Adult Health

## 2021-12-01 NOTE — Telephone Encounter (Signed)
Pt wife Opal Sidles is calling and would like finally dx and blood work results. Pt seen on cory on 11-26-2021

## 2022-04-26 ENCOUNTER — Telehealth: Payer: Self-pay | Admitting: Adult Health

## 2022-04-26 NOTE — Telephone Encounter (Signed)
Opal Sidles (spouse) was transferred to me.  She stated patient is wanting to get an EKG done at his [facility] appt on 5/23.  I let her know patient was schedule as phone visit.  I also asked why patient wanted an EKG done, was he having any problems.  She asked patient and he said he was having heaviness in his chest.  I transferred patient to triage.  Dana @ [facility] was going to help patient.

## 2022-04-26 NOTE — Telephone Encounter (Signed)
--  Caller has heaviness in the chest - states this Morning  04/26/2022 9:55:50 AM Call EMS 911 Now Doyle Askew, RN, Beth  Comments User: Melene Muller, RN Date/Time Eilene Ghazi Time): 04/26/2022 9:58:38 AM Spoke with Candance at office of pt refusal to call EMS or go to ED as directed in post triage directives  Referrals [place] UNDECIDED REFERRED TO PCP OFFICE  04/26/22 1158 - c/o intermittent "heaviness in chest" that started several months ago. States when he has these episodes it last minutes to half hr. Feels like he has to cough when he feels it. Denies SOB or palpitations when episodes occur. Last episode was this morning & pt feels fine at this time. Appt has been scheduled with PCP for tomorrow.

## 2022-04-27 ENCOUNTER — Ambulatory Visit (INDEPENDENT_AMBULATORY_CARE_PROVIDER_SITE_OTHER): Payer: Medicare Other | Admitting: Adult Health

## 2022-04-27 ENCOUNTER — Ambulatory Visit (INDEPENDENT_AMBULATORY_CARE_PROVIDER_SITE_OTHER): Payer: Medicare Other

## 2022-04-27 ENCOUNTER — Ambulatory Visit: Payer: Medicare Other | Admitting: Adult Health

## 2022-04-27 VITALS — BP 150/90 | HR 64 | Temp 98.1°F | Ht 69.0 in | Wt 126.0 lb

## 2022-04-27 VITALS — BP 140/80 | HR 64 | Temp 98.1°F | Ht 69.0 in | Wt 126.0 lb

## 2022-04-27 DIAGNOSIS — R481 Agnosia: Secondary | ICD-10-CM | POA: Diagnosis not present

## 2022-04-27 DIAGNOSIS — Z0000 Encounter for general adult medical examination without abnormal findings: Secondary | ICD-10-CM

## 2022-04-27 DIAGNOSIS — I10 Essential (primary) hypertension: Secondary | ICD-10-CM

## 2022-04-27 DIAGNOSIS — R0789 Other chest pain: Secondary | ICD-10-CM | POA: Diagnosis not present

## 2022-04-27 MED ORDER — CHLORTHALIDONE 25 MG PO TABS: 25.0000 mg | ORAL_TABLET | Freq: Every day | ORAL | 1 refills | Status: DC

## 2022-04-27 MED ORDER — VALSARTAN 160 MG PO TABS: 160.0000 mg | ORAL_TABLET | Freq: Every day | ORAL | 1 refills | Status: DC

## 2022-04-27 NOTE — Patient Instructions (Signed)
[name] , Thank you for taking time to come for your Medicare Wellness Visit. I appreciate your ongoing commitment to your health goals. Please review the following plan we discussed and let me know if I can assist you in the future.   Screening recommendations/referrals: Colonoscopy: not required Recommended yearly ophthalmology/optometry visit for glaucoma screening and checkup Recommended yearly dental visit for hygiene and checkup  Vaccinations: Influenza vaccine: due 07/06/2022 Pneumococcal vaccine: completed 01/15/2015 Tdap vaccine: completed 11/27/2014, due 11/27/2024 Shingles vaccine: discussed   Covid-19:  06/18/2021, 01/29/2020, 01/01/2020  Advanced directives: Please bring a copy of your POA (Power of Attorney) and/or Living Will to your next appointment.   Conditions/risks identified:  none  Next appointment: Follow up in one year for your annual wellness visit.   Preventive Care 14 Years and Older, Male Preventive care refers to lifestyle choices and visits with your health care provider that can promote health and wellness. What does preventive care include? A yearly physical exam. This is also called an annual well check. Dental exams once or twice a year. Routine eye exams. Ask your health care provider how often you should have your eyes checked. Personal lifestyle choices, including: Daily care of your teeth and gums. Regular physical activity. Eating a healthy diet. Avoiding tobacco and drug use. Limiting alcohol use. Practicing safe sex. Taking low doses of aspirin every day. Taking vitamin and mineral supplements as recommended by your health care provider. What happens during an annual well check? The services and screenings done by your health care provider during your annual well check will depend on your age, overall health, lifestyle risk factors, and family history of disease. Counseling  Your health care provider may ask you questions about  your: Alcohol use. Tobacco use. Drug use. Emotional well-being. Home and relationship well-being. Sexual activity. Eating habits. History of falls. Memory and ability to understand (cognition). Work and work Statistician. Screening  You may have the following tests or measurements: Height, weight, and BMI. Blood pressure. Lipid and cholesterol levels. These may be checked every 5 years, or more frequently if you are over 70 years old. Skin check. Lung cancer screening. You may have this screening every year starting at age 47 if you have a 30-pack-year history of smoking and currently smoke or have quit within the past 15 years. Fecal occult blood test (FOBT) of the stool. You may have this test every year starting at age 60. Flexible sigmoidoscopy or colonoscopy. You may have a sigmoidoscopy every 5 years or a colonoscopy every 10 years starting at age 14. Prostate cancer screening. Recommendations will vary depending on your family history and other risks. Hepatitis C blood test. Hepatitis B blood test. Sexually transmitted disease (STD) testing. Diabetes screening. This is done by checking your blood sugar (glucose) after you have not eaten for a while (fasting). You may have this done every 1-3 years. Abdominal aortic aneurysm (AAA) screening. You may need this if you are a current or former smoker. Osteoporosis. You may be screened starting at age 83 if you are at high risk. Talk with your health care provider about your test results, treatment options, and if necessary, the need for more tests. Vaccines  Your health care provider may recommend certain vaccines, such as: Influenza vaccine. This is recommended every year. Tetanus, diphtheria, and acellular pertussis (Tdap, Td) vaccine. You may need a Td booster every 10 years. Zoster vaccine. You may need this after age 13. Pneumococcal 13-valent conjugate (PCV13) vaccine. One dose is  recommended after age 66. Pneumococcal  polysaccharide (PPSV23) vaccine. One dose is recommended after age 43. Talk to your health care provider about which screenings and vaccines you need and how often you need them. This information is not intended to replace advice given to you by your health care provider. Make sure you discuss any questions you have with your health care provider. Document Released: 12/19/2015 Document Revised: 08/11/2016 Document Reviewed: 09/23/2015 Elsevier Interactive Patient Education  2017 [place] Prevention in the Home Falls can cause injuries. They can happen to people of all ages. There are many things you can do to make your home safe and to help prevent falls. What can I do on the outside of my home? Regularly fix the edges of walkways and driveways and fix any cracks. Remove anything that might make you trip as you walk through a door, such as a raised step or threshold. Trim any bushes or trees on the path to your home. Use bright outdoor lighting. Clear any walking paths of anything that might make someone trip, such as rocks or tools. Regularly check to see if handrails are loose or broken. Make sure that both sides of any steps have handrails. Any raised decks and porches should have guardrails on the edges. Have any leaves, snow, or ice cleared regularly. Use sand or salt on walking paths during winter. Clean up any spills in your garage right away. This includes oil or grease spills. What can I do in the bathroom? Use night lights. Install grab bars by the toilet and in the tub and shower. Do not use towel bars as grab bars. Use non-skid mats or decals in the tub or shower. If you need to sit down in the shower, use a plastic, non-slip stool. Keep the floor dry. Clean up any water that spills on the floor as soon as it happens. Remove soap buildup in the tub or shower regularly. Attach bath mats securely with double-sided non-slip rug tape. Do not have throw rugs and other  things on the floor that can make you trip. What can I do in the bedroom? Use night lights. Make sure that you have a light by your bed that is easy to reach. Do not use any sheets or blankets that are too big for your bed. They should not hang down onto the floor. Have a firm chair that has side arms. You can use this for support while you get dressed. Do not have throw rugs and other things on the floor that can make you trip. What can I do in the kitchen? Clean up any spills right away. Avoid walking on wet floors. Keep items that you use a lot in easy-to-reach places. If you need to reach something above you, use a strong step stool that has a grab bar. Keep electrical cords out of the way. Do not use floor polish or wax that makes floors slippery. If you must use wax, use non-skid floor wax. Do not have throw rugs and other things on the floor that can make you trip. What can I do with my stairs? Do not leave any items on the stairs. Make sure that there are handrails on both sides of the stairs and use them. Fix handrails that are broken or loose. Make sure that handrails are as long as the stairways. Check any carpeting to make sure that it is firmly attached to the stairs. Fix any carpet that is loose or worn. Avoid having  throw rugs at the top or bottom of the stairs. If you do have throw rugs, attach them to the floor with carpet tape. Make sure that you have a light switch at the top of the stairs and the bottom of the stairs. If you do not have them, ask someone to add them for you. What else can I do to help prevent falls? Wear shoes that: Do not have high heels. Have rubber bottoms. Are comfortable and fit you well. Are closed at the toe. Do not wear sandals. If you use a stepladder: Make sure that it is fully opened. Do not climb a closed stepladder. Make sure that both sides of the stepladder are locked into place. Ask someone to hold it for you, if possible. Clearly  mark and make sure that you can see: Any grab bars or handrails. First and last steps. Where the edge of each step is. Use tools that help you move around (mobility aids) if they are needed. These include: Canes. Walkers. Scooters. Crutches. Turn on the lights when you go into a dark area. Replace any light bulbs as soon as they burn out. Set up your furniture so you have a clear path. Avoid moving your furniture around. If any of your floors are uneven, fix them. If there are any pets around you, be aware of where they are. Review your medicines with your doctor. Some medicines can make you feel dizzy. This can increase your chance of falling. Ask your doctor what other things that you can do to help prevent falls. This information is not intended to replace advice given to you by your health care provider. Make sure you discuss any questions you have with your health care provider. Document Released: 09/18/2009 Document Revised: 04/29/2016 Document Reviewed: 12/27/2014 Elsevier Interactive Patient Education  2017 [employer].

## 2022-04-27 NOTE — Patient Instructions (Signed)
I am going to order a stress test and a referral to ENT   I will send in your blood pressure medications   Please follow up with me for your memory issues.

## 2022-04-27 NOTE — Progress Notes (Signed)
Subjective:    Patient ID: Michael Miranda, male    DOB: [birth year], 81 y.o.   MRN: [id number]  HPI  81 year old male who  has a past medical history of Alcohol abuse, Arthritis, Depression, Hyperlipidemia, Hypertension, and Kidney stones.  He presents to the office today for multiple issues.   His first issue is that of  concern of " chest heaviness". Per patient report his symptoms have been intermittent over the past year or so.  He has mentioned this to his cardiologist in the past.  Chest heaviness is located in the middle of his chest.  Seems to happen more when he is excited, upset, or being active.  He does not have any noted shortness of breath.  Associated symptoms include that of dizziness at times of the chest heaviness.  His last stress test was in March 2018 which showed no significant ischemia.  He has not had any lower extremity edema.  He reports finding it difficult to schedule an appointment with his cardiologist, and rather not go back there.  He is wondering if I will prescribe him his blood pressure medications since he has had any of his medications in the last few weeks.   Additionally, he has concerns of progressive loss of taste and smell over the last few years.  He reports that his loss of taste and smell is completely gone now.  Denies decreased appetite but he is losing weight    Review of Systems See HPI   Past Medical History:  Diagnosis Date   Alcohol abuse    10 years ago    Arthritis    Depression    Hyperlipidemia    Hypertension    Kidney stones    20 years ago    Social History   Socioeconomic History   Marital status: Married    Spouse name: Not on file   Number of children: Not on file   Years of education: Not on file   Highest education level: Not on file  Occupational History   Not on file  Tobacco Use   Smoking status: Never   Smokeless tobacco: Never  Vaping Use   Vaping Use: Never used  Substance and Sexual Activity    Alcohol use: Yes    Alcohol/week: 0.0 standard drinks    Comment: Wine with dinner   Drug use: No   Sexual activity: Not on file  Other Topics Concern   Not on file  Social History Narrative   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    Social Determinants of Health   Financial Resource Strain: Not on file  Food Insecurity: Not on file  Transportation Needs: Not on file  Physical Activity: Not on file  Stress: Not on file  Social Connections: Not on file  Intimate Partner Violence: Not on file    Past Surgical History:  Procedure Laterality Date   COCHLEAR IMPLANT     bilateral   HERNIA REPAIR      Family History  Problem Relation Age of Onset   Heart attack Brother    Coronary artery disease Brother    Cancer Father        lung   Dementia Mother     No Known Allergies  Current Outpatient Medications on File  Prior to Visit  Medication Sig Dispense Refill   magic mouthwash w/lidocaine SOLN Take 5 mLs by mouth 3 (three) times daily as needed. Gargle and spit 180 mL 0   rosuvastatin (CRESTOR) 5 MG tablet TAKE 1 TABLET(5 MG) BY MOUTH DAILY 90 tablet 0   valsartan (DIOVAN) 160 MG tablet Take 1 tablet (160 mg total) by mouth daily. (Patient not taking: Reported on 04/27/2022) 30 tablet 5   No current facility-administered medications on file prior to visit.    BP 140/80   Pulse 64   Temp 98.1 F (36.7 C) (Oral)   Ht '5\' 9"'$  (1.753 m)   Wt 126 lb (57.2 kg)   SpO2 96%   BMI 18.61 kg/m       Objective:   Physical Exam Vitals reviewed.  Constitutional:      Appearance: Normal appearance.  Cardiovascular:     Rate and Rhythm: Normal rate and regular rhythm.     Pulses: Normal pulses.     Heart sounds: Normal heart sounds.  Pulmonary:     Effort: Pulmonary effort is normal.     Breath sounds: Normal breath sounds.   Musculoskeletal:        General: Normal range of motion.  Skin:    General: Skin is warm and dry.  Neurological:     General: No focal deficit present.     Mental Status: He is alert and oriented to person, place, and time.  Psychiatric:        Mood and Affect: Mood normal.        Behavior: Behavior normal.        Thought Content: Thought content normal.        Judgment: Judgment normal.      Assessment & Plan:  1. Chest heaviness  - EKG 12-Lead - Sinus  Bradycardia  -Right atrial enlargement.   -Old anteroseptal infarct.   Low voltage with rightward P-axis and rotation -possible pulmonary disease.  Rate 58 - Exercise Tolerance Test; Future - Cardiac Stress Test: Informed Consent Details: Physician/Practitioner Attestation; Transcribe to consent form and obtain patient signature - Will schedule stress test since he does not want to go back to cardiology at this time   2. Loss of perception for taste  - Ambulatory referral to ENT  3. Essential hypertension - Will send in his blood pressure medications  - valsartan (DIOVAN) 160 MG tablet; Take 1 tablet (160 mg total) by mouth daily.  Dispense: 90 tablet; Refill: 1 - chlorthalidone (HYGROTON) 25 MG tablet; Take 1 tablet (25 mg total) by mouth daily.  Dispense: 90 tablet; Refill: 1  Dorothyann Peng, NP   Time of total for care on the day of the encounter 45 minutes. This includes time spent in both face-to-face and non-face-to-face activities including preparing for the visit, reviewing the chart, time spent with patient, evaluation and counseling patient/family/caregiver, coordinating care, and time spent documenting in the chart which was performed on the date of service (04/27/2022). Note: this excludes any time spent performing billable procedures or separate charges (such as time spent counseling smoking cessation); these charges are billed separately.

## 2022-04-27 NOTE — Progress Notes (Signed)
Subjective:   Michael Miranda is a 81 y.o. male who presents for Medicare Annual/Subsequent preventive examination.  Review of Systems     Cardiac Risk Factors include: advanced age (>53mn, >>62women);hypertension;male gender     Objective:    Today's Vitals   04/27/22 1040  BP: (!) 150/90  Pulse: 64  Temp: 98.1 F (36.7 C)  TempSrc: Oral  SpO2: 96%  Weight: 126 lb (57.2 kg)  Height: '5\' 9"'$  (1.753 m)   Body mass index is 18.61 kg/m.     04/27/2022   10:45 AM 11/26/2021    5:30 PM 04/21/2021   10:37 AM 11/18/2016    9:35 AM 07/23/2016    8:46 AM 07/23/2016    8:43 AM  Advanced Directives  Does Patient Have a Medical Advance Directive? Yes Yes Yes Yes Yes Yes  Type of AP[place]amed[place]of [place] will [place] will Healthcare Power of [place] will    Copy of [place] Chart? No - copy requested  No - copy requested Yes No - copy requested No - copy requested    Current Medications (verified) Outpatient Encounter Medications as of 04/27/2022  Medication Sig   magic mouthwash w/lidocaine SOLN Take 5 mLs by mouth 3 (three) times daily as needed. Gargle and spit   rosuvastatin (CRESTOR) 5 MG tablet TAKE 1 TABLET(5 MG) BY MOUTH DAILY   valsartan (DIOVAN) 160 MG tablet Take 1 tablet (160 mg total) by mouth daily. (Patient not taking: Reported on 04/27/2022)   No facility-administered encounter medications on file as of 04/27/2022.    Allergies (verified) Patient has no known allergies.   History: Past Medical History:  Diagnosis Date   Alcohol abuse    10 years ago    Arthritis    Depression    Hyperlipidemia    Hypertension    Kidney stones    20 years ago   Past Surgical History:  Procedure Laterality Date   COCHLEAR IMPLANT     bilateral   HERNIA REPAIR     Family History  Problem Relation Age of Onset   Heart attack Brother    Coronary artery  disease Brother    Cancer Father        lung   Dementia Mother    Social History   Socioeconomic History   Marital status: Married    Spouse name: Not on file   Number of children: Not on file   Years of education: Not on file   Highest education level: Not on file  Occupational History   Not on file  Tobacco Use   Smoking status: Never   Smokeless tobacco: Never  Vaping Use   Vaping Use: Never used  Substance and Sexual Activity   Alcohol use: Not Currently    Comment: no drinking in 6 months   Drug use: No   Sexual activity: Not on file  Other Topics Concern   Not on file  Social History Narrative   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    Social Determinants of Health   Financial Resource Strain: Low Risk    Difficulty of Paying Living Expenses: Not hard at all  Food Insecurity: No Food Insecurity   Worried About Running Out  of Food in the Last Year: Never true   [place] in the Last Year: Never true  Transportation Needs: No Transportation Needs   Lack of Transportation (Medical): No   Lack of Transportation (Non-Medical): No  Physical Activity: Insufficiently Active   Days of Exercise per Week: 3 days   Minutes of Exercise per Session: 20 min  Stress: No Stress Concern Present   Feeling of Stress : Not at all  Social Connections: Not on file    Tobacco Counseling Counseling given: Not Answered   Clinical Intake:  Pre-visit preparation completed: Yes  Pain : No/denies pain     Nutritional Status: BMI of 19-24  Normal Nutritional Risks: None Diabetes: No  How often do you need to have someone help you when you read instructions, pamphlets, or other written materials from your doctor or pharmacy?: 1 - Never What is the last grade level you completed in school?: college  Diabetic? no  Interpreter Needed?:  No  Information entered by :: NAllen LPN   Activities of Daily Living    04/27/2022   10:47 AM  In your present state of health, do you have any difficulty performing the following activities:  Hearing? 1  Comment cochlear implants  Vision? 0  Difficulty concentrating or making decisions? 1  Walking or climbing stairs? 0  Dressing or bathing? 0  Doing errands, shopping? 1  Preparing Food and eating ? N  Using the Toilet? N  In the past six months, have you accidently leaked urine? Y  Do you have problems with loss of bowel control? N  Managing your Medications? N  Managing your Finances? N  Housekeeping or managing your Housekeeping? N    Patient Care Team: Dorothyann Peng, NP as PCP - General (Family Medicine) Sherren Mocha, MD as PCP - Cardiology (Cardiology)  Indicate any recent Medical Services you may have received from other than [facility] providers in the past year (date may be approximate).     Assessment:   This is a routine wellness examination for [place].  Hearing/Vision screen Vision Screening - Comments:: No regular eye exams,   Dietary issues and exercise activities discussed: Current Exercise Habits: Home exercise routine, Type of exercise: walking, Time (Minutes): 20, Frequency (Times/Week): 3, Weekly Exercise (Minutes/Week): 60   Goals Addressed             This Visit's Progress    Patient Stated       04/27/2022, no goals       Depression Screen    04/27/2022   10:46 AM 04/21/2021   10:34 AM 12/12/2018    2:48 PM 10/24/2018   11:19 AM 08/29/2018    2:09 PM 11/22/2017    8:55 AM 11/27/2014    9:59 AM  PHQ 2/9 Scores  PHQ - 2 Score 1 1 0 0 2 0 0  PHQ- 9 Score     9      Fall Risk    04/27/2022   10:46 AM 04/21/2021   10:38 AM 12/12/2018    2:48 PM 10/24/2018   11:19 AM 08/29/2018    2:09 PM  [place] in the past year? 0 0 0 0 No  Number falls in past yr: 0 0     Injury with Fall? 0 0     Risk for fall due to : Medication side  effect Impaired vision     Follow up Falls evaluation completed;Education provided;Falls prevention discussed Falls prevention discussed  FALL RISK PREVENTION PERTAINING TO THE HOME:  Any stairs in or around the home? Yes  If so, are there any without handrails? No  Home free of loose throw rugs in walkways, pet beds, electrical cords, etc? Yes  Adequate lighting in your home to reduce risk of falls? Yes   ASSISTIVE DEVICES UTILIZED TO PREVENT FALLS:  Life alert? No  Use of a cane, walker or w/c? No  Grab bars in the bathroom? Yes  Shower chair or bench in shower? No  Elevated toilet seat or a handicapped toilet? No   TIMED UP AND GO:  Was the test performed? No .     Gait slow and steady without use of assistive device  Cognitive Function:        04/27/2022   10:49 AM  6CIT Screen  What Year? 0 points  What month? 0 points  What time? 0 points  Count back from 20 0 points  Months in reverse 0 points  Repeat phrase 2 points  Total Score 2 points    Immunizations Immunization History  Administered Date(s) Administered   Fluad Quad(high Dose 65+) 10/06/2021   Influenza Split 09/18/2012   Influenza, High Dose Seasonal PF 10/01/2013, 09/18/2015, 09/02/2017, 09/30/2018, 09/13/2019   Influenza-Unspecified 08/29/2020   Moderna Sars-Covid-2 Vaccination 01/01/2020, 01/29/2020   PFIZER Comirnaty(Gray Top)Covid-19 Tri-Sucrose Vaccine 06/18/2021   Pneumococcal Conjugate-13 01/15/2015   Pneumococcal Polysaccharide-23 12/06/2009   Td 11/27/2014   Zoster, Live 12/06/2006    TDAP status: Up to date  Flu Vaccine status: Up to date  Pneumococcal vaccine status: Up to date  Covid-19 vaccine status: Completed vaccines  Qualifies for Shingles Vaccine? Yes   Zostavax completed Yes   Shingrix Completed?: No.    Education has been provided regarding the importance of this vaccine. Patient has been advised to call insurance company to determine out of pocket expense if  they have not yet received this vaccine. Advised may also receive vaccine at local pharmacy or Health Dept. Verbalized acceptance and understanding.  Screening Tests Health Maintenance  Topic Date Due   Zoster Vaccines- Shingrix (1 of 2) Never done   COVID-19 Vaccine (4 - Booster) 08/13/2021   INFLUENZA VACCINE  07/06/2022   TETANUS/TDAP  11/27/2024   Pneumonia Vaccine 67+ Years old  Completed   HPV VACCINES  Aged Out    Health Maintenance  Health Maintenance Due  Topic Date Due   Zoster Vaccines- Shingrix (1 of 2) Never done   COVID-19 Vaccine (4 - Booster) 08/13/2021    Colorectal cancer screening: No longer required.   Lung Cancer Screening: (Low Dose CT Chest recommended if Age 32-80 years, 30 pack-year currently smoking OR have quit w/in 15years.) does not qualify.   Lung Cancer Screening Referral: none  Additional Screening:  Hepatitis C Screening: does not qualify;   Vision Screening: Recommended annual ophthalmology exams for early detection of glaucoma and other disorders of the eye. Is the patient up to date with their annual eye exam?  No  Who is the provider or what is the name of the office in which the patient attends annual eye exams?  If pt is not established with a provider, would they like to be referred to a provider to establish care? No .   Dental Screening: Recommended annual dental exams for proper oral hygiene  Community Resource Referral / Chronic Care Management: CRR required this visit?  No   CCM required this visit?  No      Plan:  I have personally reviewed and noted the following in the patient's chart:   Medical and social history Use of alcohol, tobacco or illicit drugs  Current medications and supplements including opioid prescriptions. Patient is not currently taking opioid prescriptions. Functional ability and status Nutritional status Physical activity Advanced directives List of other physicians Hospitalizations,  surgeries, and ER visits in previous 12 months Vitals Screenings to include cognitive, depression, and falls Referrals and appointments  In addition, I have reviewed and discussed with patient certain preventive protocols, quality metrics, and best practice recommendations. A written personalized care plan for preventive services as well as general preventive health recommendations were provided to patient.     Kellie Simmering, LPN   6/71/2458   Nurse Notes: none

## 2022-05-11 ENCOUNTER — Encounter: Payer: Self-pay | Admitting: Adult Health

## 2022-05-11 ENCOUNTER — Ambulatory Visit (INDEPENDENT_AMBULATORY_CARE_PROVIDER_SITE_OTHER): Payer: Medicare Other | Admitting: Adult Health

## 2022-05-11 VITALS — BP 120/70 | HR 67 | Temp 97.9°F | Ht 69.0 in | Wt 122.0 lb

## 2022-05-11 DIAGNOSIS — R4189 Other symptoms and signs involving cognitive functions and awareness: Secondary | ICD-10-CM

## 2022-05-11 NOTE — Patient Instructions (Addendum)
It was great seeing you today   You do not have any signs of dementia  Your ENT referral was placed   Dr. Benjamine Mola  Address: [address] [place], [place], [place] [address] Phone: [phone]

## 2022-05-11 NOTE — Progress Notes (Signed)
Subjective:    Patient ID: Michael Miranda, male    DOB: Apr 27, 1941, 81 y.o.   MRN: [id number]  HPI 81 year old male who  has a past medical history of Alcohol abuse, Arthritis, Depression, Hyperlipidemia, Hypertension, and Kidney stones.  He presents to the office today for perceived memory loss. His wife is here today with him to help supplement history.   His wife reports that his memory issue seems to be more short term memory loss from 1 day to 2 weeks. Denies issues with getting lost while driving, forgetting family memories names, or forgetting where he places things in the house.   He does report that he will forget to turn off the stove or sink from time to time. His wife is doing most of the cooking now.    Review of Systems See HPI   Past Medical History:  Diagnosis Date   Alcohol abuse    10 years ago    Arthritis    Depression    Hyperlipidemia    Hypertension    Kidney stones    20 years ago    Social History   Socioeconomic History   Marital status: Married    Spouse name: Not on file   Number of children: Not on file   Years of education: Not on file   Highest education level: Not on file  Occupational History   Not on file  Tobacco Use   Smoking status: Never   Smokeless tobacco: Never  Vaping Use   Vaping Use: Never used  Substance and Sexual Activity   Alcohol use: Not Currently    Comment: no drinking in 6 months   Drug use: No   Sexual activity: Not on file  Other Topics Concern   Not on file  Social History Narrative   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    Social Determinants of Health   Financial Resource Strain: Low Risk    Difficulty of Paying Living Expenses: Not hard at all  Food Insecurity: No Food Insecurity   Worried About Charity fundraiser in the Last Year: Never true   [place]  in the Last Year: Never true  Transportation Needs: No Transportation Needs   Lack of Transportation (Medical): No   Lack of Transportation (Non-Medical): No  Physical Activity: Insufficiently Active   Days of Exercise per Week: 3 days   Minutes of Exercise per Session: 20 min  Stress: No Stress Concern Present   Feeling of Stress : Not at all  Social Connections: Not on file  Intimate Partner Violence: Not on file    Past Surgical History:  Procedure Laterality Date   COCHLEAR IMPLANT     bilateral   HERNIA REPAIR      Family History  Problem Relation Age of Onset   Heart attack Brother    Coronary artery disease Brother    Cancer Father        lung   Dementia Mother     No Known Allergies  Current Outpatient Medications on File Prior to Visit  Medication Sig Dispense Refill   chlorthalidone (HYGROTON) 25 MG tablet Take 1 tablet (25 mg total) by mouth daily. 90 tablet 1   valsartan (DIOVAN) 160 MG tablet Take 1 tablet (160  mg total) by mouth daily. 90 tablet 1   No current facility-administered medications on file prior to visit.    BP 120/70   Pulse 67   Temp 97.9 F (36.6 C) (Oral)   Ht '5\' 9"'$  (1.753 m)   Wt 122 lb (55.3 kg)   SpO2 100%   BMI 18.02 kg/m       Objective:   Physical Exam Vitals and nursing note reviewed.  Constitutional:      Appearance: Normal appearance.  Cardiovascular:     Rate and Rhythm: Normal rate and regular rhythm.     Pulses: Normal pulses.     Heart sounds: Normal heart sounds.  Pulmonary:     Effort: Pulmonary effort is normal.     Breath sounds: Normal breath sounds.  Musculoskeletal:        General: Normal range of motion.  Skin:    General: Skin is warm and dry.  Neurological:     General: No focal deficit present.     Mental Status: He is alert and oriented to person, place, and time.  Psychiatric:        Mood and Affect: Mood normal.        Behavior: Behavior normal.        Thought Content: Thought content  normal.        Judgment: Judgment normal.       Assessment & Plan:  1. Cognitive impairment - MMSE 27/30 - missed recall  - PQH 9 = 4 - GAD 7 - 3  -Likely has some normal age-related short-term memory loss but MMSE within normal range.  Discussed MRI, will forego at this time due to cochlear implants.  Advised to stay active and eat healthy.  Follow-up as needed for this issue  Dorothyann Peng, NP  Time of total for care on the day of the encounter 35 minutes. This includes time spent in both face-to-face and non-face-to-face activities including preparing for the visit, reviewing the chart, time spent with patient, evaluation and counseling patient/family/caregiver, coordinating care, and time spent documenting in the chart which was performed on the date of service (05/11/2022). Note: this excludes any time spent performing billable procedures or separate charges (such as time spent counseling smoking cessation); these charges are billed separately.

## 2022-05-20 ENCOUNTER — Ambulatory Visit (INDEPENDENT_AMBULATORY_CARE_PROVIDER_SITE_OTHER): Payer: Medicare Other

## 2022-05-20 DIAGNOSIS — R0789 Other chest pain: Secondary | ICD-10-CM

## 2022-05-20 LAB — EXERCISE TOLERANCE TEST
Angina Index: 0
Estimated workload: 5.9
Exercise duration (min): 4 min
Exercise duration (sec): 28 s
MPHR: 140 {beats}/min
Peak HR: 140 {beats}/min
Percent HR: 70 %
RPE: 15
Rest HR: 63 {beats}/min

## 2022-05-27 ENCOUNTER — Telehealth: Payer: Self-pay | Admitting: Adult Health

## 2022-05-27 NOTE — Telephone Encounter (Signed)
Pt's wife called to state referral for ENT must be revised because Provider has moved his practice and is no longer providing the services needed. Please advise. Please call back at [phone] .

## 2022-05-28 NOTE — Telephone Encounter (Signed)
Sent a note to referral coordinator for assistance.

## 2022-08-12 DIAGNOSIS — K219 Gastro-esophageal reflux disease without esophagitis: Secondary | ICD-10-CM | POA: Diagnosis not present

## 2022-08-12 DIAGNOSIS — H903 Sensorineural hearing loss, bilateral: Secondary | ICD-10-CM | POA: Diagnosis not present

## 2022-08-12 DIAGNOSIS — R438 Other disturbances of smell and taste: Secondary | ICD-10-CM | POA: Diagnosis not present

## 2022-08-12 DIAGNOSIS — Z9621 Cochlear implant status: Secondary | ICD-10-CM | POA: Diagnosis not present

## 2022-09-09 ENCOUNTER — Ambulatory Visit: Payer: Medicare Other

## 2022-09-09 ENCOUNTER — Ambulatory Visit (INDEPENDENT_AMBULATORY_CARE_PROVIDER_SITE_OTHER): Payer: Medicare Other | Admitting: *Deleted

## 2022-09-09 DIAGNOSIS — Z23 Encounter for immunization: Secondary | ICD-10-CM | POA: Diagnosis not present

## 2022-10-20 ENCOUNTER — Telehealth: Payer: Self-pay | Admitting: *Deleted

## 2022-10-20 NOTE — Patient Outreach (Signed)
  Care Coordination   10/20/2022 Name: Michael Miranda MRN: [id number] DOB: 06/19/1941   Care Coordination Outreach Attempts:  An unsuccessful telephone outreach was attempted today to offer the patient information about available care coordination services as a benefit of their health plan.   Follow Up Plan:  Additional outreach attempts will be made to offer the patient care coordination information and services.   Encounter Outcome:  No Answer  Care Coordination Interventions Activated:  No   Care Coordination Interventions:  No, not indicated    Raina Mina, RN Care Management Coordinator [place] Office [phone]

## 2022-11-08 ENCOUNTER — Other Ambulatory Visit: Payer: Self-pay | Admitting: Adult Health

## 2022-11-08 DIAGNOSIS — I10 Essential (primary) hypertension: Secondary | ICD-10-CM

## 2023-01-27 ENCOUNTER — Telehealth: Payer: Self-pay

## 2023-01-27 NOTE — Patient Outreach (Signed)
  Care Coordination   Initial Visit Note   01/27/2023 Name: Michael Miranda MRN: [id number] DOB: December 16, 1940  Michael Miranda is a 82 y.o. year old male who sees Nafziger, Tommi Rumps, NP for primary care. I spoke with  Michael Miranda by phone today.  What matters to the patients health and wellness today?  none    Goals Addressed             This Visit's Progress    COMPLETED: Care Coordination Activities-No follow up required       Interventions Today    Flowsheet Row Most Recent Value  Chronic Disease   Chronic disease during today's visit Hypertension (HTN)  General Interventions   General Interventions Discussed/Reviewed General Interventions Discussed, Doctor Visits  Doctor Visits Discussed/Reviewed Doctor Visits Discussed, Annual Wellness Visits  Education Interventions   Education Provided Provided Education  Provided Verbal Education On Other  [Hypertension]              SDOH assessments and interventions completed:  Yes  SDOH Interventions Today    Flowsheet Row Most Recent Value  SDOH Interventions   Food Insecurity Interventions Intervention Not Indicated  Housing Interventions Intervention Not Indicated  Transportation Interventions Intervention Not Indicated        Care Coordination Interventions:  Yes, provided   Follow up plan: No further intervention required.   Encounter Outcome:  Pt. Visit Completed   Jone Baseman, RN, MSN [facility] Care Management Coordinator Direct Line [phone]

## 2023-01-27 NOTE — Patient Instructions (Signed)
Visit Information  Thank you for taking time to visit with me today. Please don't hesitate to contact me if I can be of assistance to you.   Following are the goals we discussed today:   Goals Addressed             This Visit's Progress    COMPLETED: Care Coordination Activities-No follow up required       Interventions Today    Flowsheet Row Most Recent Value  Chronic Disease   Chronic disease during today's visit Hypertension (HTN)  General Interventions   General Interventions Discussed/Reviewed General Interventions Discussed, Doctor Visits  Doctor Visits Discussed/Reviewed Doctor Visits Discussed, Annual Wellness Visits  Education Interventions   Education Provided Provided Education  Provided Verbal Education On Other  [Hypertension]               If you are experiencing a Mental Health or [place] or need someone to talk to, please call the Suicide and Crisis Lifeline: 988   Patient verbalizes understanding of instructions and care plan provided today and agrees to view in [place]. Active MyChart status and patient understanding of how to access instructions and care plan via MyChart confirmed with patient.     The patient has been provided with contact information for the care management team and has been advised to call with any health related questions or concerns.   Jone Baseman, RN, MSN [facility] Care Management Coordinator Direct Line [phone]

## 2023-06-21 NOTE — Telephone Encounter (Signed)
 This encounter was created in error - please disregard.

## 2023-07-01 ENCOUNTER — Telehealth: Payer: Self-pay | Admitting: Adult Health

## 2023-07-01 DIAGNOSIS — Z974 Presence of external hearing-aid: Secondary | ICD-10-CM

## 2023-07-01 NOTE — Telephone Encounter (Signed)
Referral placed for audiology.

## 2023-07-01 NOTE — Telephone Encounter (Signed)
Requesting a referral to [facility] [address] [place], needs this for the processor on his hearing aid (they think)

## 2023-07-02 IMAGING — DX DG CHEST 2V
2 series · 2 of 2 positions shown · non-contrast
Comparison: 08/15/2018.  Chest CT of 09/01/2018.

CLINICAL DATA: Cough for 2 weeks

EXAM:
CHEST - 2 VIEW

[chest pa]
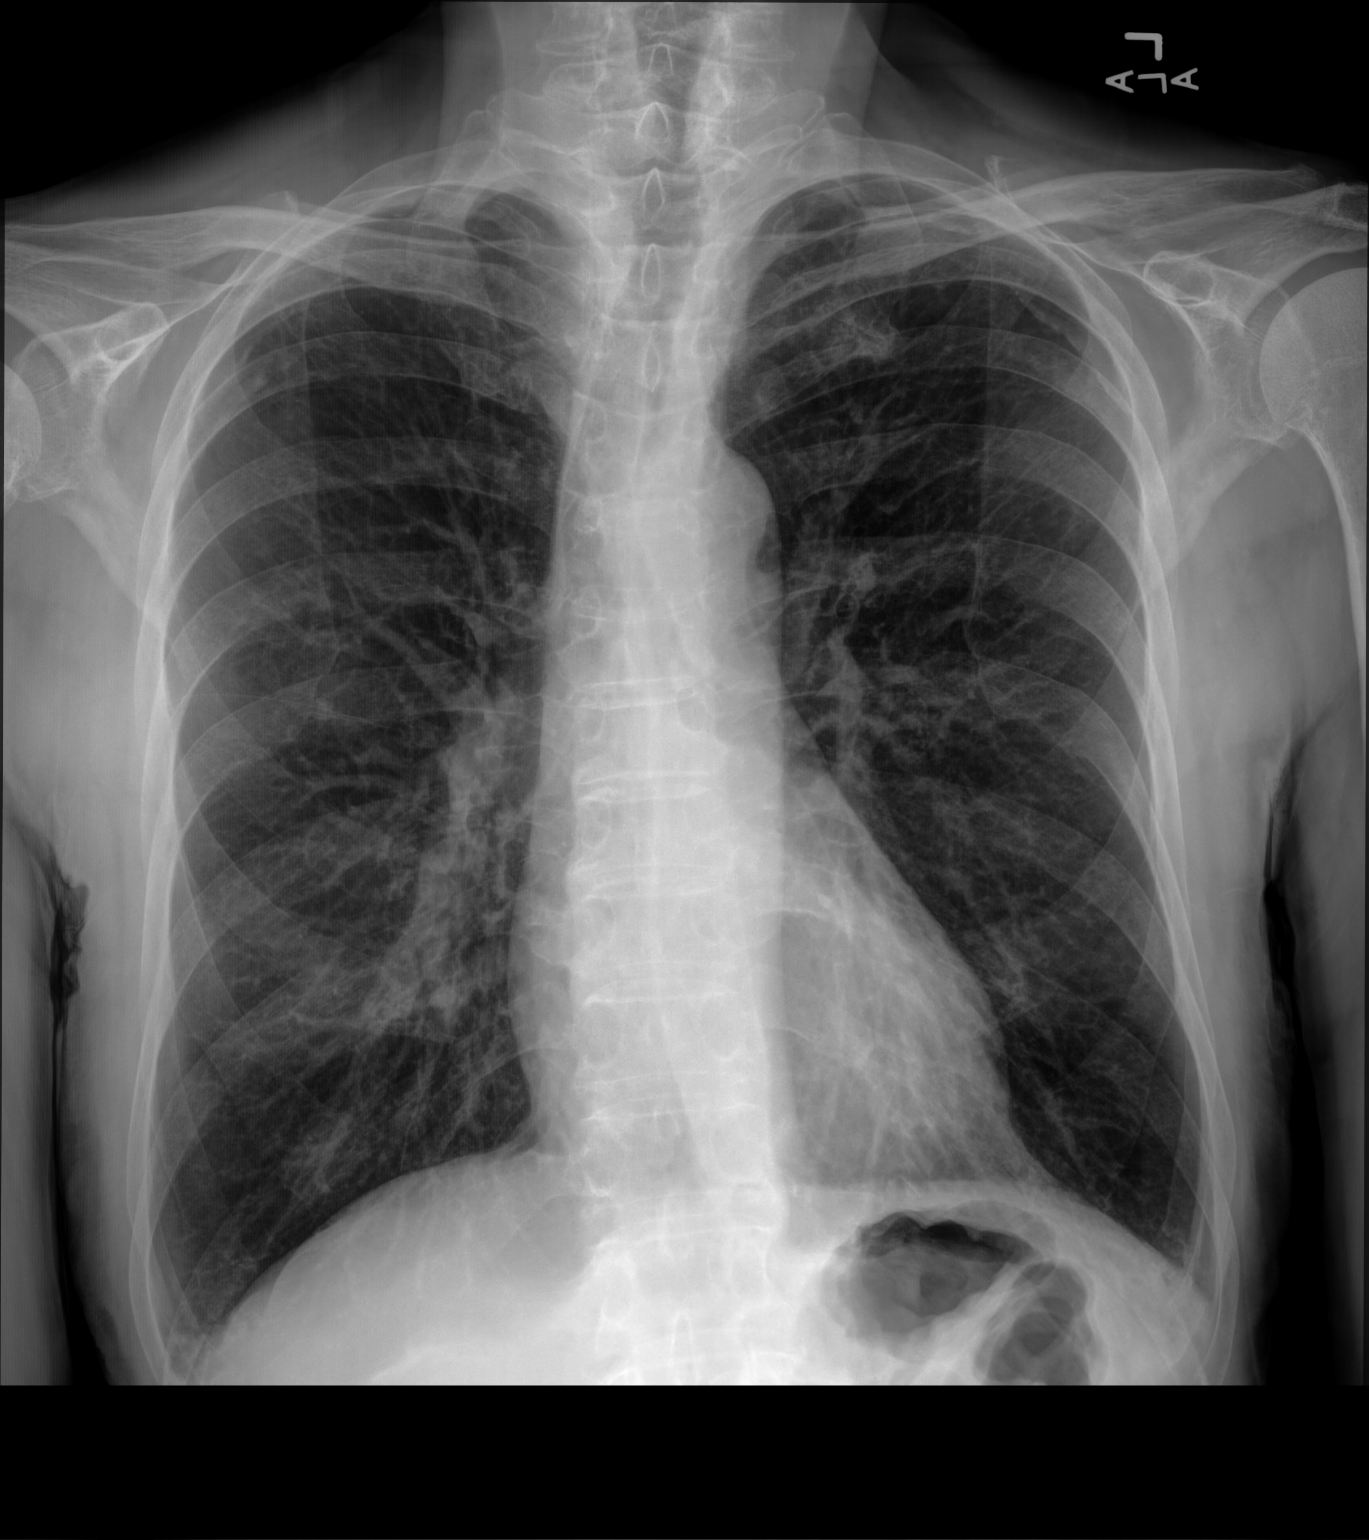

[chest lat]
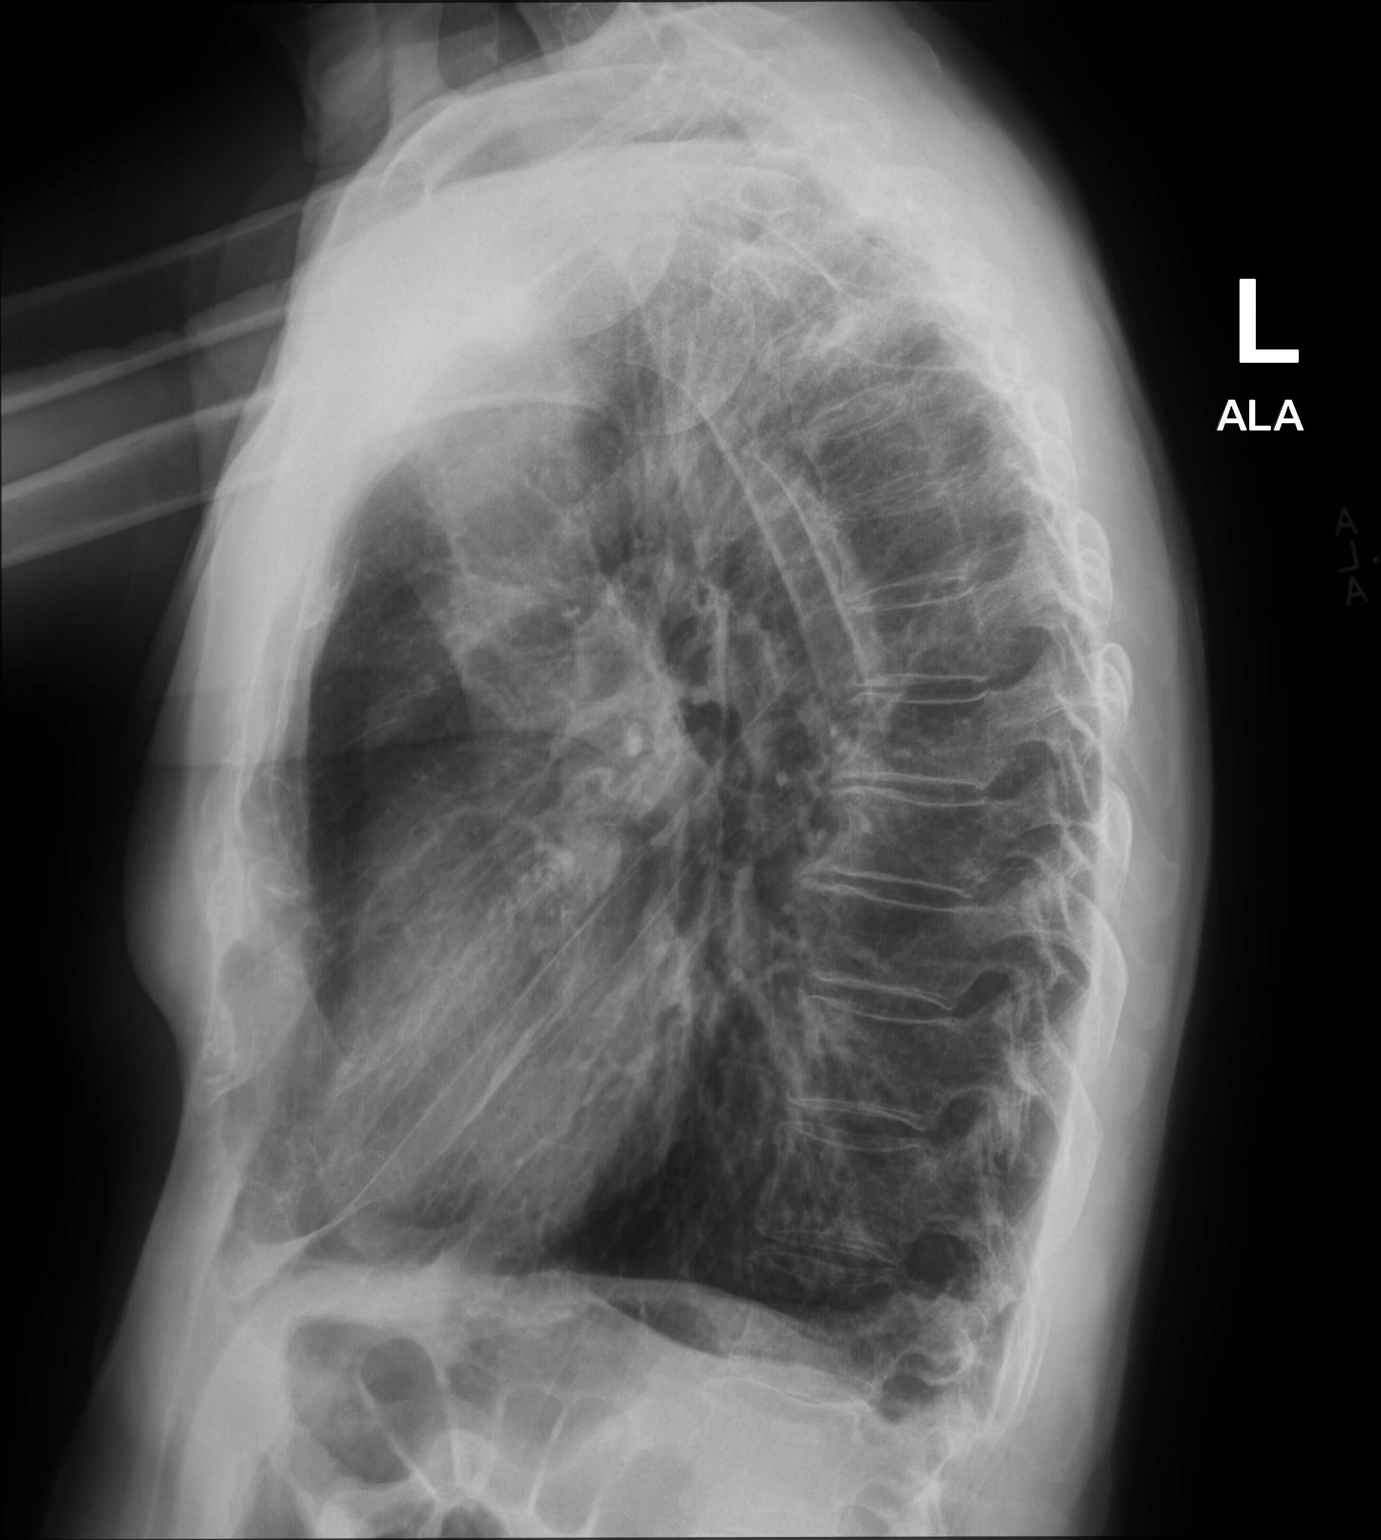

[2 of 2 positions shown; findings below may reference images not displayed]

FINDINGS: Mild hyperinflation. Convex right thoracic spine curvature is mild.
Midline trachea. Normal heart size and mediastinal contours. No
pleural effusion or pneumothorax. Mild biapical pleuroparenchymal
scarring. No lobar consolidation. Prominent anterior first ribs,
similar.
IMPRESSION: No acute cardiopulmonary disease.

## 2023-07-05 NOTE — Telephone Encounter (Signed)
The referral was sent to  [facility] on [address] in [place]. Please send to [address] [place] attn elaine orcutt

## 2023-07-25 ENCOUNTER — Telehealth: Payer: Self-pay

## 2023-07-25 NOTE — Telephone Encounter (Signed)
Unsuccessful attempts to reach patient on preferred number listed in notes for scheduled AWV. Left message on voicemail okay to reschedule.

## 2023-08-26 DIAGNOSIS — H903 Sensorineural hearing loss, bilateral: Secondary | ICD-10-CM | POA: Diagnosis not present

## 2023-09-22 ENCOUNTER — Other Ambulatory Visit: Payer: Medicare Other

## 2023-09-22 ENCOUNTER — Ambulatory Visit: Payer: Medicare Other | Admitting: Adult Health

## 2023-09-22 ENCOUNTER — Encounter: Payer: Self-pay | Admitting: Adult Health

## 2023-09-22 VITALS — BP 126/80 | HR 82 | Temp 97.9°F | Ht 66.75 in | Wt 125.0 lb

## 2023-09-22 DIAGNOSIS — R351 Nocturia: Secondary | ICD-10-CM | POA: Diagnosis not present

## 2023-09-22 DIAGNOSIS — E785 Hyperlipidemia, unspecified: Secondary | ICD-10-CM

## 2023-09-22 DIAGNOSIS — N401 Enlarged prostate with lower urinary tract symptoms: Secondary | ICD-10-CM | POA: Diagnosis not present

## 2023-09-22 DIAGNOSIS — I10 Essential (primary) hypertension: Secondary | ICD-10-CM | POA: Diagnosis not present

## 2023-09-22 LAB — COMPREHENSIVE METABOLIC PANEL
ALT: 15 U/L (ref 0–53)
AST: 22 U/L (ref 0–37)
Albumin: 4.1 g/dL (ref 3.5–5.2)
Alkaline Phosphatase: 97 U/L (ref 39–117)
BUN: 16 mg/dL (ref 6–23)
CO2: 29 meq/L (ref 19–32)
Calcium: 9.2 mg/dL (ref 8.4–10.5)
Chloride: 103 meq/L (ref 96–112)
Creatinine, Ser: 1.01 mg/dL (ref 0.40–1.50)
GFR: 69.53 mL/min (ref >60.00)
Glucose, Bld: 86 mg/dL (ref 70–99)
Potassium: 4.2 meq/L (ref 3.5–5.1)
Sodium: 141 meq/L (ref 135–145)
Total Bilirubin: 0.7 mg/dL (ref 0.2–1.2)
Total Protein: 6.3 g/dL (ref 6.0–8.3)

## 2023-09-22 LAB — CBC
HCT: 42.8 % (ref 39.0–52.0)
Hemoglobin: 14.3 g/dL (ref 13.0–17.0)
MCHC: 33.4 g/dL (ref 30.0–36.0)
MCV: 99.8 fL (ref 78.0–100.0)
Platelets: 190 10*3/uL (ref 150.0–400.0)
RBC: 4.28 Mil/uL (ref 4.22–5.81)
RDW: 13.6 % (ref 11.5–15.5)
WBC: 5.3 10*3/uL (ref 4.0–10.5)

## 2023-09-22 LAB — LIPID PANEL
Cholesterol: 217 mg/dL — ABNORMAL HIGH (ref 0–200)
HDL: 79.4 mg/dL (ref >39.00)
LDL Cholesterol: 127 mg/dL — ABNORMAL HIGH (ref 0–99)
NonHDL: 138.01
Total CHOL/HDL Ratio: 3
Triglycerides: 56 mg/dL (ref 0.0–149.0)
VLDL: 11.2 mg/dL (ref 0.0–40.0)

## 2023-09-22 LAB — TSH: TSH: 0.67 u[IU]/mL (ref 0.35–5.50)

## 2023-09-22 LAB — PSA: PSA: 0.71 ng/mL (ref 0.10–4.00)

## 2023-09-22 NOTE — Patient Instructions (Signed)
It was great seeing you today   We will follow up with you regarding your lab work   Please let me know if you need anything

## 2023-09-22 NOTE — Addendum Note (Signed)
Addended by: Nancy Fetter on: 09/22/2023 12:25 PM   Modules accepted: Orders

## 2023-09-22 NOTE — Progress Notes (Signed)
Subjective:    Patient ID: Michael Miranda, male    DOB: 01-Jul-1941, 82 y.o.   MRN: [id number]  HPI Patient presents for yearly preventative medicine examination. He is a pleasant 82 year old male who  has a past medical history of Alcohol abuse, Arthritis, Depression, Hyperlipidemia, Hypertension, and Kidney stones.  Hypertension -Takes chlorthalidone 25 mg daily and diovan 160 mg daily - he reports that he has not beeb taking any of his blood pressure mediation for the last " two years", his wife who is with him agrees with this. .  He does monitor his blood pressures at home with readings consistently in the 120s over 70s.  BP Readings from Last 3 Encounters:  09/22/23 126/80  05/11/22 120/70  04/27/22 (!) 150/90   Hyperlipidemia -is not taking his statin.  Lab Results  Component Value Date   CHOL 204 (H) 05/13/2021   HDL 87.50 05/13/2021   LDLCALC 102 (H) 05/13/2021   LDLDIRECT 92.3 10/01/2013   TRIG 75.0 05/13/2021   CHOLHDL 2 05/13/2021   BPH - has nocturia - gets up twice a night.  Lab Results  Component Value Date   PSA 0.44 05/13/2021   PSA 0.84 11/18/2016   PSA 1.02 11/25/2015    All immunizations and health maintenance protocols were reviewed with the patient and needed orders were placed.  Appropriate screening laboratory values were ordered for the patient including screening of hyperlipidemia, renal function and hepatic function.  Medication reconciliation,  past medical history, social history, problem list and allergies were reviewed in detail with the patient. He stays active and eating healthy   Wt Readings from Last 3 Encounters:  09/22/23 125 lb (56.7 kg)  05/11/22 122 lb (55.3 kg)  04/27/22 126 lb (57.2 kg)     Goals were established with regard to weight loss, exercise, and  diet in compliance with medications  Review of Systems  Constitutional: Negative.   HENT: Negative.    Eyes: Negative.   Respiratory: Negative.    Cardiovascular:  Negative.   Gastrointestinal: Negative.   Endocrine: Negative.   Genitourinary: Negative.   Musculoskeletal: Negative.   Skin: Negative.   Allergic/Immunologic: Negative.   Neurological: Negative.   Hematological: Negative.   Psychiatric/Behavioral: Negative.    All other systems reviewed and are negative.  Past Medical History:  Diagnosis Date   Alcohol abuse    10 years ago    Arthritis    Depression    Hyperlipidemia    Hypertension    Kidney stones    20 years ago    Social History   Socioeconomic History   Marital status: Married    Spouse name: Not on file   Number of children: Not on file   Years of education: Not on file   Highest education level: Not on file  Occupational History   Not on file  Tobacco Use   Smoking status: Never   Smokeless tobacco: Never  Vaping Use   Vaping status: Never Used  Substance and Sexual Activity   Alcohol use: Not Currently    Comment: no drinking in 6 months   Drug use: No   Sexual activity: Not on file  Other Topics Concern   Not on file  Social History Narrative   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    Social Determinants of Health   Financial Resource Strain: Low Risk  (04/27/2022)   Overall Financial Resource Strain (CARDIA)    Difficulty of Paying Living Expenses: Not hard at all  Food Insecurity: No Food Insecurity (01/27/2023)   Hunger Vital Sign    Worried About Running Out of Food in the Last Year: Never true    Ran Out of Food in the Last Year: Never true  Transportation Needs: No Transportation Needs (01/27/2023)   PRAPARE - Administrator, Civil Service (Medical): No    Lack of Transportation (Non-Medical): No  Physical Activity: Insufficiently Active (04/27/2022)   Exercise Vital Sign    Days of Exercise per Week: 3 days    Minutes of Exercise per Session:  20 min  Stress: No Stress Concern Present (04/27/2022)   [employer] of Occupational Health - Occupational Stress Questionnaire    Feeling of Stress : Not at all  Social Connections: Moderately Isolated (04/21/2021)   Social Connection and Isolation Panel [NHANES]    Frequency of Communication with Friends and Family: Twice a week    Frequency of Social Gatherings with Friends and Family: Never    Attends Religious Services: 1 to 4 times per year    Active Member of [employer] or Organizations: No    Attends Banker Meetings: Never    Marital Status: Married  Catering manager Violence: Not At Risk (04/21/2021)   Humiliation, Afraid, Rape, and Kick questionnaire    Fear of Current or Ex-Partner: No    Emotionally Abused: No    Physically Abused: No    Sexually Abused: No    Past Surgical History:  Procedure Laterality Date   COCHLEAR IMPLANT     bilateral   HERNIA REPAIR      Family History  Problem Relation Age of Onset   Heart attack Brother    Coronary artery disease Brother    Cancer Father        lung   Dementia Mother     No Known Allergies  No current outpatient medications on file prior to visit.   No current facility-administered medications on file prior to visit.    BP 126/80   Pulse 82   Temp 97.9 F (36.6 C) (Oral)   Ht 5' 6.75" (1.695 m)   Wt 125 lb (56.7 kg)   SpO2 96%   BMI 19.72 kg/m       Objective:   Physical Exam Vitals and nursing note reviewed.  Constitutional:      General: He is not in acute distress.    Appearance: Normal appearance. He is not ill-appearing.  HENT:     Head: Normocephalic and atraumatic.     Right Ear: Tympanic membrane, ear canal and external ear normal. There is no impacted cerumen.     Left Ear: Tympanic membrane, ear canal and external ear normal. There is no impacted cerumen.     Nose: Nose normal. No congestion or rhinorrhea.     Mouth/Throat:     Mouth: Mucous membranes are moist.      Pharynx: Oropharynx is clear.  Eyes:     Extraocular Movements: Extraocular movements intact.     Conjunctiva/sclera: Conjunctivae normal.     Pupils: Pupils are equal, round, and reactive to light.  Neck:     Vascular: No carotid bruit.  Cardiovascular:     Rate and Rhythm: Normal rate and regular rhythm.     Pulses: Normal  pulses.     Heart sounds: No murmur heard.    No friction rub. No gallop.  Pulmonary:     Effort: Pulmonary effort is normal.     Breath sounds: Normal breath sounds.  Abdominal:     General: Abdomen is flat. Bowel sounds are normal. There is no distension.     Palpations: Abdomen is soft. There is no mass.     Tenderness: There is no abdominal tenderness. There is no guarding or rebound.     Hernia: No hernia is present.  Musculoskeletal:        General: Normal range of motion.     Cervical back: Normal range of motion and neck supple.  Lymphadenopathy:     Cervical: No cervical adenopathy.  Skin:    General: Skin is warm and dry.     Capillary Refill: Capillary refill takes less than 2 seconds.  Neurological:     General: No focal deficit present.     Mental Status: He is alert and oriented to person, place, and time.  Psychiatric:        Mood and Affect: Mood normal.        Behavior: Behavior normal.        Thought Content: Thought content normal.        Judgment: Judgment normal.        Assessment & Plan:  1. Essential hypertension - BP controlled in the office today. Seems to be controlled at home without medication. Will d/c medications   2. Hyperlipidemia, unspecified hyperlipidemia type - Consider adding back statin   3. Benign prostatic hyperplasia with nocturia - Continue to monitor. Does not want medication    Shirline Frees, NP

## 2023-09-27 ENCOUNTER — Other Ambulatory Visit: Payer: Self-pay

## 2023-09-27 MED ORDER — ROSUVASTATIN CALCIUM 10 MG PO TABS: 10.0000 mg | ORAL_TABLET | Freq: Every day | ORAL | 3 refills | Status: DC

## 2023-10-26 DIAGNOSIS — H903 Sensorineural hearing loss, bilateral: Secondary | ICD-10-CM | POA: Diagnosis not present

## 2023-11-24 ENCOUNTER — Ambulatory Visit: Payer: Medicare Other | Admitting: Adult Health

## 2023-11-24 ENCOUNTER — Encounter: Payer: Self-pay | Admitting: Adult Health

## 2023-11-24 VITALS — BP 120/60 | HR 67 | Temp 97.9°F | Ht 66.5 in | Wt 128.0 lb

## 2023-11-24 DIAGNOSIS — R410 Disorientation, unspecified: Secondary | ICD-10-CM | POA: Diagnosis not present

## 2023-11-24 DIAGNOSIS — R4189 Other symptoms and signs involving cognitive functions and awareness: Secondary | ICD-10-CM

## 2023-11-24 NOTE — Progress Notes (Signed)
Subjective:    Patient ID: Michael Miranda, male    DOB: 04-20-1941, 82 y.o.   MRN: [id number]  HPI  82 year old male who  has a past medical history of Alcohol abuse, Arthritis, Depression, Hyperlipidemia, Hypertension, and Kidney stones.  He presents to the office today with his wife for cognitive issues.   Patient reports that he does not feel as "sharp" as he once did.  His wife reports that she noticed some increased confusion when they had moved into an apartment after moving out of their house, she had noticed that he seemed turned around and was getting rooms confused but this has improved since they have been in the apartment for a longer period of time.  Also seems to be some confusion relating to the patient's sister and him wanting to know if the sister has called to check in on him.  He does not drive any longer but does have an active driver's license.  Has not had any UTI like symptoms    Review of Systems See HPI   Past Medical History:  Diagnosis Date   Alcohol abuse    10 years ago    Arthritis    Depression    Hyperlipidemia    Hypertension    Kidney stones    20 years ago    Social History   Socioeconomic History   Marital status: Married    Spouse name: Not on file   Number of children: Not on file   Years of education: Not on file   Highest education level: Not on file  Occupational History   Not on file  Tobacco Use   Smoking status: Never   Smokeless tobacco: Never  Vaping Use   Vaping status: Never Used  Substance and Sexual Activity   Alcohol use: Not Currently    Comment: no drinking in 6 months   Drug use: No   Sexual activity: Not on file  Other Topics Concern   Not on file  Social History Narrative   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    Social Drivers of Corporate investment banker  Strain: Low Risk  (04/27/2022)   Overall Financial Resource Strain (CARDIA)    Difficulty of Paying Living Expenses: Not hard at all  Food Insecurity: No Food Insecurity (01/27/2023)   Hunger Vital Sign    Worried About Running Out of Food in the Last Year: Never true    Ran Out of Food in the Last Year: Never true  Transportation Needs: No Transportation Needs (01/27/2023)   PRAPARE - Administrator, Civil Service (Medical): No    Lack of Transportation (Non-Medical): No  Physical Activity: Insufficiently Active (04/27/2022)   Exercise Vital Sign    Days of Exercise per Week: 3 days    Minutes of Exercise per Session: 20 min  Stress: No Stress Concern Present (04/27/2022)   [employer] of Occupational Health - Occupational Stress Questionnaire    Feeling of Stress : Not at all  Social Connections: Moderately Isolated (04/21/2021)   Social Connection and Isolation Panel [NHANES]    Frequency of Communication with Friends and Family: Twice a week    Frequency of Social Gatherings with Friends and Family: Never    Attends Religious Services: 1  to 4 times per year    Active Member of Clubs or Organizations: No    Attends Banker Meetings: Never    Marital Status: Married  Catering manager Violence: Not At Risk (04/21/2021)   Humiliation, Afraid, Rape, and Kick questionnaire    Fear of Current or Ex-Partner: No    Emotionally Abused: No    Physically Abused: No    Sexually Abused: No    Past Surgical History:  Procedure Laterality Date   COCHLEAR IMPLANT     bilateral   HERNIA REPAIR      Family History  Problem Relation Age of Onset   Heart attack Brother    Coronary artery disease Brother    Cancer Father        lung   Dementia Mother     No Known Allergies  Current Outpatient Medications on File Prior to Visit  Medication Sig Dispense Refill   rosuvastatin (CRESTOR) 10 MG tablet Take 1 tablet (10 mg total) by mouth daily. 90 tablet 3   No  current facility-administered medications on file prior to visit.    BP 120/60   Pulse 67   Temp 97.9 F (36.6 C) (Oral)   Ht 5' 6.5" (1.689 m)   Wt 128 lb (58.1 kg)   SpO2 97%   BMI 20.35 kg/m       Objective:   Physical Exam Vitals and nursing note reviewed.  Constitutional:      Appearance: Normal appearance.  Cardiovascular:     Rate and Rhythm: Normal rate and regular rhythm.     Pulses: Normal pulses.     Heart sounds: Normal heart sounds.  Musculoskeletal:        General: Normal range of motion.  Skin:    General: Skin is warm and dry.  Neurological:     General: No focal deficit present.     Mental Status: He is alert and oriented to person, place, and time.  Psychiatric:        Mood and Affect: Mood normal.        Behavior: Behavior normal.        Thought Content: Thought content normal.        Cognition and Memory: Memory is impaired.        Judgment: Judgment normal.     Comments: Thought it was October and 2015 during MMSE        Assessment & Plan:  1. Cognitive impairment (Primary)    11/24/2023   11:49 AM 11/24/2023   11:47 AM 05/11/2022   11:50 AM  MMSE - Mini Mental State Exam  Orientation to time 3 3 5   Orientation to Place 2  5  Registration 3  3  Attention/ Calculation 5  5  Recall 1  0  Language- name 2 objects 2  2  Language- repeat 1  1  Language- follow 3 step command 3  3  Language- read & follow direction 1  1  Write a sentence 1  1  Copy design 1  1  Total score 23  27  - There has has been memory loss since the last MMSR in 05/2022.  - Will order head CT since he cannot have MRI d/t cochlear implants    - CT HEAD WO CONTRAST ( ); Future - Ambulatory referral to Neurology  2. Disorientation, unspecified  - CT HEAD WO CONTRAST ( ); Future

## 2023-11-28 ENCOUNTER — Telehealth: Payer: Self-pay

## 2023-11-28 NOTE — Telephone Encounter (Signed)
Copied from CRM [id number]. Topic: Clinical - Medical Advice >> Nov 28, 2023  2:03 PM Desma Mcgregor wrote: Reason for CRM: Erskine Squibb asking for a callback to clarify why the pt was referred to a sleep specialist who is an internist, when he doesn't have a problem sleeping. I briefly explained to caller that the referral shows Dr. [name] is a MD in neurology, but still some confusion there. CB# [phone] Voicemail available

## 2023-11-29 NOTE — Telephone Encounter (Signed)
Spoke to pt spouse and she stated that she thought the referred doctor only does sleep studies. I advised pt that the doctor is a neurologist still and may be able to help pt. I advised her to call back and schedule.

## 2023-11-29 NOTE — Telephone Encounter (Signed)
Please advise 

## 2023-12-09 DIAGNOSIS — Z45321 Encounter for adjustment and management of cochlear device: Secondary | ICD-10-CM | POA: Diagnosis not present

## 2023-12-09 DIAGNOSIS — H9193 Unspecified hearing loss, bilateral: Secondary | ICD-10-CM | POA: Diagnosis not present

## 2023-12-14 ENCOUNTER — Ambulatory Visit: Payer: Self-pay

## 2023-12-14 NOTE — Telephone Encounter (Signed)
  Chief Complaint: tremors Symptoms: tremors in hands, comes and goes, fever  Frequency: today  Pertinent Negatives: Patient denies HA, dizziness, vision changes Disposition: [] ED /[] Urgent Care (no appt availability in office) / [] Appointment(In office/virtual)/ []  [facility]/ [] Home Care/ [] Refused Recommended Disposition /[] [facility]/ [x]  Follow-up with PCP Additional Notes: pt's wife states that pt had hand tremors earlier but not new has had previously. Feels like he has a fever but unsure temp d/t no thermometer. Pt felt warm on forehead so she put cold washcloth on head. No red flags heard during assessment, advised to call PCP tomorrow and give Tylenol as needed for fever. Wife appreciated conversation and will call PCP tomorrow.

## 2023-12-14 NOTE — Telephone Encounter (Signed)
 Reason for Disposition . [1] Weakness of arm / hand, or leg / foot AND [2] is a chronic symptom (recurrent or ongoing AND present > 4 weeks)  Answer Assessment - Initial Assessment Questions 1. SYMPTOM: What is the main symptom you are concerned about? (e.g., weakness, numbness)     Tremors in hand  2. ONSET: When did this start? (minutes, hours, days; while sleeping)     Today around 4pm  3. LAST NORMAL: When was the last time you (the patient) were normal (no symptoms)?     today 4. PATTERN Does this come and go, or has it been constant since it started?  Is it present now?     Comes and goes  5. CARDIAC SYMPTOMS: Have you had any of the following symptoms: chest pain, difficulty breathing, palpitations?     no 6. NEUROLOGIC SYMPTOMS: Have you had any of the following symptoms: headache, dizziness, vision loss, double vision, changes in speech, unsteady on your feet?     131/66 HR 73 7. OTHER SYMPTOMS: Do you have any other symptoms?     Fever  Protocols used: Neurologic Deficit-A-AH

## 2023-12-14 NOTE — Telephone Encounter (Signed)
 Pt's wife calling in because pt having tremors and fever, while transferring to NT she disconnected line. Devona Konig on # on file but no answer, LVMTCB. Also called from [phone], no answer. Unable to LVM d/t line keeps ringing.

## 2023-12-16 ENCOUNTER — Ambulatory Visit
Admission: RE | Admit: 2023-12-16 | Discharge: 2023-12-16 | Disposition: A | Discharge status: Home / Self Care | Payer: Medicare Other | Source: Ambulatory Visit | Attending: Adult Health | Admitting: Adult Health

## 2023-12-16 DIAGNOSIS — R4189 Other symptoms and signs involving cognitive functions and awareness: Secondary | ICD-10-CM

## 2023-12-16 DIAGNOSIS — R413 Other amnesia: Secondary | ICD-10-CM | POA: Diagnosis not present

## 2023-12-16 DIAGNOSIS — R410 Disorientation, unspecified: Secondary | ICD-10-CM

## 2023-12-28 ENCOUNTER — Telehealth: Payer: Self-pay | Admitting: Adult Health

## 2023-12-28 NOTE — Telephone Encounter (Unsigned)
Copied from CRM [id number]. Topic: Clinical - Lab/Test Results >> Dec 28, 2023  4:39 PM Clayton Bibles wrote: Reason for CRM: Please call spouse or Celio to explain his CT scan results of the head. Please call [phone]

## 2024-01-03 ENCOUNTER — Other Ambulatory Visit: Payer: Self-pay | Admitting: *Deleted

## 2024-01-03 MED ORDER — DONEPEZIL HCL 5 MG PO TABS: 5.0000 mg | ORAL_TABLET | Freq: Every day | ORAL | 1 refills | Status: AC

## 2024-01-03 NOTE — Telephone Encounter (Signed)
Please see result note

## 2024-02-14 ENCOUNTER — Ambulatory Visit (INDEPENDENT_AMBULATORY_CARE_PROVIDER_SITE_OTHER): Payer: Medicare Other | Admitting: Adult Health

## 2024-02-14 VITALS — BP 122/80 | HR 58 | Temp 97.4°F | Ht 66.5 in

## 2024-02-14 DIAGNOSIS — R4189 Other symptoms and signs involving cognitive functions and awareness: Secondary | ICD-10-CM | POA: Diagnosis not present

## 2024-02-14 NOTE — Addendum Note (Signed)
 Addended by: Nancy Fetter on: 02/14/2024 03:24 PM   Modules accepted: Level of Service

## 2024-02-14 NOTE — Progress Notes (Signed)
 Subjective:    Patient ID: Michael Miranda, male    DOB: 03/03/1941, 83 y.o.   MRN: [id number]  HPI  83 year old male who  has a past medical history of Alcohol abuse, Arthritis, Depression, Hyperlipidemia, Hypertension, and Kidney stones.  He presents to the office today with his wife for cognitive impairment.  He was last seen in December 2024 after his wife noticed some increased confusion after he had moved into an apartment.  Patient was getting rooms confused in their new place and was also experiencing some confusion related to the patient's sister.  His MMSE was 23 out of 30.  He was unable to have an MRI due to cochlear implants CT of the head showed moderate atrophy and chronic small vessel disease.  He was placed on Aricept 5 mg at bedtime and referred to neurology.  His neurology appointment is in April.  Today his wife reports that he is doing well and seems to be managing his cognitive issues without much difficulty. He does not seem as confused. He never did start Aricept as he felt like he did not need it.    Review of Systems See HPI   Past Medical History:  Diagnosis Date   Alcohol abuse    10 years ago    Arthritis    Depression    Hyperlipidemia    Hypertension    Kidney stones    20 years ago    Social History   Socioeconomic History   Marital status: Married    Spouse name: Not on file   Number of children: Not on file   Years of education: Not on file   Highest education level: Not on file  Occupational History   Not on file  Tobacco Use   Smoking status: Never   Smokeless tobacco: Never  Vaping Use   Vaping status: Never Used  Substance and Sexual Activity   Alcohol use: Not Currently    Comment: no drinking in 6 months   Drug use: No   Sexual activity: Not on file  Other Topics Concern   Not on file  Social History Narrative   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    Social Drivers of Corporate investment banker Strain: Low Risk  (04/27/2022)   Overall Financial Resource Strain (CARDIA)    Difficulty of Paying Living Expenses: Not hard at all  Food Insecurity: No Food Insecurity (01/27/2023)   Hunger Vital Sign    Worried About Running Out of Food in the Last Year: Never true    Ran Out of Food in the Last Year: Never true  Transportation Needs: No Transportation Needs (01/27/2023)   PRAPARE - Administrator, Civil Service (Medical): No    Lack of Transportation (Non-Medical): No  Physical Activity: Insufficiently Active (04/27/2022)   Exercise Vital Sign    Days of Exercise per Week: 3 days    Minutes of Exercise per Session: 20 min  Stress: No Stress Concern Present (04/27/2022)   [employer] of Occupational Health - Occupational Stress Questionnaire    Feeling of Stress : Not at all  Social Connections: Moderately Isolated (04/21/2021)   Social Connection and Isolation Panel [NHANES]    Frequency of Communication with Friends and Family: Twice a  week    Frequency of Social Gatherings with Friends and Family: Never    Attends Religious Services: 1 to 4 times per year    Active Member of [employer] or Organizations: No    Attends Banker Meetings: Never    Marital Status: Married  Catering manager Violence: Not At Risk (04/21/2021)   Humiliation, Afraid, Rape, and Kick questionnaire    Fear of Current or Ex-Partner: No    Emotionally Abused: No    Physically Abused: No    Sexually Abused: No    Past Surgical History:  Procedure Laterality Date   COCHLEAR IMPLANT     bilateral   HERNIA REPAIR      Family History  Problem Relation Age of Onset   Heart attack Brother    Coronary artery disease Brother    Cancer Father        lung   Dementia Mother     No Known Allergies  Current Outpatient Medications on File Prior to Visit  Medication Sig  Dispense Refill   donepezil (ARICEPT) 5 MG tablet Take 1 tablet (5 mg total) by mouth at bedtime. 30 tablet 1   rosuvastatin (CRESTOR) 10 MG tablet Take 1 tablet (10 mg total) by mouth daily. 90 tablet 3   No current facility-administered medications on file prior to visit.    BP 122/80   Pulse (!) 58   Temp (!) 97.4 F (36.3 C) (Oral)   Ht 5' 6.5" (1.689 m)   SpO2 98%   BMI 20.35 kg/m       Objective:   Physical Exam Vitals and nursing note reviewed.  Constitutional:      Appearance: Normal appearance.  Cardiovascular:     Rate and Rhythm: Normal rate and regular rhythm.     Pulses: Normal pulses.     Heart sounds: Normal heart sounds.  Pulmonary:     Effort: Pulmonary effort is normal.     Breath sounds: Normal breath sounds.  Musculoskeletal:        General: Normal range of motion.  Skin:    General: Skin is warm and dry.  Neurological:     General: No focal deficit present.     Mental Status: He is alert and oriented to person, place, and time.  Psychiatric:        Mood and Affect: Mood normal.        Behavior: Behavior normal.        Thought Content: Thought content normal.        Judgment: Judgment normal.       Assessment & Plan:  1. Cognitive impairment (Primary) - Discussed he purpose of Arciept with the patient and his wife and that we use it to help slow down progression but that this medication will not cure his symptoms. Patient and wife were agreeable to start it and will follow up in with Neurology in the next month.   Time spent with patient today was 32 minutes which consisted of chart review, discussing cognitive impairment, work up, treatment answering questions and documentation.

## 2024-03-08 ENCOUNTER — Ambulatory Visit: Payer: Self-pay | Admitting: Neurology

## 2024-03-08 ENCOUNTER — Encounter: Payer: Self-pay | Admitting: Neurology

## 2024-03-08 VITALS — BP 119/71 | HR 58 | Ht 68.0 in | Wt 132.0 lb

## 2024-03-08 DIAGNOSIS — R001 Bradycardia, unspecified: Secondary | ICD-10-CM

## 2024-03-08 DIAGNOSIS — R413 Other amnesia: Secondary | ICD-10-CM | POA: Diagnosis not present

## 2024-03-08 NOTE — Patient Instructions (Signed)
 Please try to hydrate better with water, 6 to 8 cups of water per day are recommended, 8 ounce size each.  Try to exercise on a regular basis such as in the form of walking.  Continue with the donepezil 5 mg at bedtime for now as prescribed by your PCP.  With your next blood work I recommend rechecking your thyroid function and your vitamin B12 levels again.  Keep your scheduled appointment for your regular vision check and hearing aid check.

## 2024-03-08 NOTE — Progress Notes (Signed)
 Subjective:    Patient ID: Michael Miranda is a 83 y.o. male.  HPI    [name], MD, PhD [facility] [address] [address] [place], [place] [address]  Dear [name],   I saw your patient, Michael Miranda, upon your kind request in my neurologic clinic today for initial consultation of his memory loss.  The patient is accompanied by his wife today.  As you know, Michael Miranda is an 83 year old male with an underlying medical history of arthritis, hyperlipidemia, hypertension, kidney stones, depression, prior history of alcohol use disorder (per chart review), and hearing loss, with status post cochlear implants bilaterally, who reports a 47-month history of memory loss.  He reports being forgetful.  He has some word finding difficulty.  His wife reports that he stopped driving about 6 months ago.  She felt he was no longer safe to drive as he was not keeping in his name for example.  He has not had any one-sided weakness or numbness or tingling or droopy face or slurring of speech.  Has been occasional headaches in the front which are dull and achy, not severe enough to take medication for them.  Patient reports sleeping well.  She has not noticed any snoring or gasping sounds or pauses in his breathing and in fact feels that he sleeps rather well.  Sometimes he is sleepy during the day.  His Epworth sleepiness score is 6 out of 24, fatigue severity score is 27 out of 63.  He was started on donepezil 5 mg strength recently and takes 1 pill at bedtime, seems to tolerate it well.  He reports a family history of memory loss affecting 2 or 3 aunts but unclear which side of the family.  They lived to be in their 23s and 90s from what I understand.  He had 2 brothers who passed away in their 37s.  They apparently did not have any memory loss and he has a sister who is alive at age 59 or thereabouts.  They moved about 12 months ago.  He reports that he stopped walking on a regular  basis at the time.  He is a non-smoker and does not currently drink any alcohol, stop drinking alcohol about 7 or 8 years ago per wife.  They do both endorse that he had alcohol use disorder for several years.  He does not always hydrate well per wife, he estimates that he drinks about 4 or 5 cups of water per day, she thinks it is less than that. I reviewed your office note from 11/24/2023.  He reported not driving any longer at the time.  His MMSE was 23 at the time.  Compared to June 2023 there was a decline in his MMSE.  He could not get an MRI of the brain due to cochlear implants.  He had a head CT without contrast on 12/16/2023 and I reviewed the results:   IMPRESSION: No specific or reversible cause for symptoms. Moderate atrophy and chronic small vessel disease.   He has been on donepezil 5 mg.  He had blood work through your office in October 2024 and I reviewed the results in his chart, including lipid panel, TSH and CBC, PSA and CMP.  LDL was elevated at 127 at the time, total cholesterol mildly elevated at 217 at the time.  He had a B12 check in 2022 which was normal at the time. He has not fallen.  He has a routine eye  exam about once a year.  He is hard of hearing per wife, despite bilateral cochlear implants.  His Past Medical History Is Significant For: Past Medical History:  Diagnosis Date   Alcohol abuse    10 years ago    Arthritis    Depression    Hyperlipidemia    Hypertension    Kidney stones    20 years ago    His Past Surgical History Is Significant For: Past Surgical History:  Procedure Laterality Date   COCHLEAR IMPLANT     bilateral   HERNIA REPAIR      His Family History Is Significant For: Family History  Problem Relation Age of Onset   Heart attack Brother    Coronary artery disease Brother    Cancer Father        lung   Dementia Mother     His Social History Is Significant For: Social History   Socioeconomic History   Marital status: Married     Spouse name: Not on file   Number of children: Not on file   Years of education: Not on file   Highest education level: Not on file  Occupational History   Not on file  Tobacco Use   Smoking status: Never   Smokeless tobacco: Never  Vaping Use   Vaping status: Never Used  Substance and Sexual Activity   Alcohol use: Not Currently    Comment: no drinking in 6 months   Drug use: No   Sexual activity: Not on file  Other Topics Concern   Not on file  Social History Narrative   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    Social Drivers of Corporate investment banker Strain: Low Risk  (04/27/2022)   Overall Financial Resource Strain (CARDIA)    Difficulty of Paying Living Expenses: Not hard at all  Food Insecurity: No Food Insecurity (01/27/2023)   Hunger Vital Sign    Worried About Running Out of Food in the Last Year: Never true    Ran Out of Food in the Last Year: Never true  Transportation Needs: No Transportation Needs (01/27/2023)   PRAPARE - Administrator, Civil Service (Medical): No    Lack of Transportation (Non-Medical): No  Physical Activity: Insufficiently Active (04/27/2022)   Exercise Vital Sign    Days of Exercise per Week: 3 days    Minutes of Exercise per Session: 20 min  Stress: No Stress Concern Present (04/27/2022)   [employer] of Occupational Health - Occupational Stress Questionnaire    Feeling of Stress : Not at all  Social Connections: Moderately Isolated (04/21/2021)   Social Connection and Isolation Panel [NHANES]    Frequency of Communication with Friends and Family: Twice a week    Frequency of Social Gatherings with Friends and Family: Never    Attends Religious Services: 1 to 4 times per year    Active Member of [employer] or Organizations: No    Attends Banker Meetings: Never    Marital  Status: Married    His Allergies Are:  No Known Allergies:   His Current Medications Are:  Outpatient Encounter Medications as of 03/08/2024  Medication Sig   donepezil (ARICEPT) 5 MG tablet Take 1 tablet (5 mg total) by mouth at bedtime.  rosuvastatin (CRESTOR) 10 MG tablet Take 1 tablet (10 mg total) by mouth daily.   No facility-administered encounter medications on file as of 03/08/2024.  :   Review of Systems:  Out of a complete 14 point review of systems, all are reviewed and negative with the exception of these symptoms as listed below:   Review of Systems  Neurological:        Room 9  Pt is here with his Wife. Pt states that he feels okay.     Objective:  Neurological Exam  Physical Exam Physical Examination:   Vitals:   03/08/24 1438  BP: 119/71  Pulse: (!) 58   General Examination: The patient is a very pleasant 83 y.o. male in no acute distress. He appears well-developed and well-nourished and well groomed.   HEENT: Normocephalic, atraumatic, pupils are equal, round and reactive to light, extraocular tracking is good without limitation to gaze excursion or nystagmus noted.  Corrective eyeglasses in place, bilateral mild cataracts noted, hearing is impaired, bilateral cochlear implants in place.  Face is symmetric with normal facial animation. Speech is clear with no dysarthria noted. There is no hypophonia. There is no lip, neck/head, jaw or voice tremor. Neck is supple with full range of passive and active motion. There are no carotid bruits on auscultation. Oropharynx exam reveals: mild mouth dryness, adequate dental hygiene and mild airway crowding, due to small airway.  Tongue protrudes centrally and palate elevates symmetrically.  Chest: Clear to auscultation without wheezing, rhonchi or crackles noted.  Heart: S1+S2+0, regular and normal without murmurs, rubs or gallops noted.  Mild bradycardia noted.  Abdomen: Soft, non-tender and  non-distended.  Extremities: There is no pitting edema in the distal lower extremities bilaterally.   Skin: Warm and dry without trophic changes noted.   Musculoskeletal: exam reveals no obvious joint deformities.   Neurologically:  Mental status: The patient is awake, pays attention but is unable to give a very elaborate history, history is supplemented by his wife who provides limited additional information.  Thought process is linear. Mood is constricted and affect is normal to slightly blunted.      11/24/2023   11:49 AM 11/24/2023   11:47 AM 05/11/2022   11:50 AM 07/23/2016    8:49 AM  MMSE - Mini Mental State Exam  Not completed:    --  Orientation to time 3 3 5    Orientation to Place 2  5   Registration 3  3   Attention/ Calculation 5  5   Recall 1  0   Language- name 2 objects 2  2   Language- repeat 1  1   Language- follow 3 step command 3  3   Language- read & follow direction 1  1   Write a sentence 1  1   Copy design 1  1   Total score 23  27    On 03/08/2024: CDT: 1/4, AFT: 4/min.   Cranial nerves II - XII are as described above under HEENT exam.  Motor exam: Normal bulk, strength and tone is noted. There is no obvious action or resting tremor.   Reflexes are 1+ in the upper extremities and trace in the lower extremities.    Fine motor skills and coordination: grossly intact.  Cerebellar testing: No dysmetria or intention tremor. There is no truncal or gait ataxia.  Sensory exam: intact to light touch in the upper and lower extremities.  Gait, station and balance: He stands easily. No veering to  one side is noted. No leaning to one side is noted. Posture is age-appropriate and stance is narrow based. Gait shows normal stride length and normal pace. No problems turning are noted.   Assessment and Plan:  In summary, EUAN WANDLER is a very pleasant 83 y.o.-year old male with an underlying medical history of arthritis, hyperlipidemia, hypertension, kidney stones,  depression, prior history of alcohol use disorder (per chart review), and hearing loss, with status post cochlear implants bilaterally, who presents for evaluation of his memory loss of approximately 6 months' duration.  He has multiple vascular risk factors, prior alcohol use disorder may also be a contributor to his cognitive decline over time.  He had recent workup through your office including blood work and a CT scan of the head, he is not eligible for a brain MRI due to cochlear implants.  He has been on low-dose donepezil and tolerates it.  I would not necessarily increase it at this time as he has evidence of bradycardia.  He is advised to maintain a healthy lifestyle, the importance of good nutrition, physical activity and good hydration with water are emphasized today.  He does not appear to be at risk for obstructive sleep apnea, nevertheless, I offered him a sleep study but he would like to think about it.  For now, I recommend he stay the course, increase his water intake to about 6 to 8 cups of water per day, try to exercise in the form of walking.  He is advised to follow-up in this clinic in about 6 months for recheck.  I answered all their questions today and the patient and his wife were in agreement.   Thank you very much for allowing me to participate in the care of this nice patient. If I can be of any further assistance to you please do not hesitate to call me at [phone].  Sincerely,   [name], MD, PhD

## 2024-04-18 DIAGNOSIS — H903 Sensorineural hearing loss, bilateral: Secondary | ICD-10-CM | POA: Diagnosis not present

## 2024-04-18 DIAGNOSIS — Z45321 Encounter for adjustment and management of cochlear device: Secondary | ICD-10-CM | POA: Diagnosis not present

## 2024-04-18 DIAGNOSIS — Z9621 Cochlear implant status: Secondary | ICD-10-CM | POA: Diagnosis not present

## 2024-06-04 DIAGNOSIS — L219 Seborrheic dermatitis, unspecified: Secondary | ICD-10-CM | POA: Diagnosis not present

## 2024-06-04 DIAGNOSIS — L570 Actinic keratosis: Secondary | ICD-10-CM | POA: Diagnosis not present

## 2024-06-12 DIAGNOSIS — H903 Sensorineural hearing loss, bilateral: Secondary | ICD-10-CM | POA: Diagnosis not present

## 2024-06-22 ENCOUNTER — Encounter: Payer: Self-pay | Admitting: Advanced Practice Midwife

## 2024-07-09 ENCOUNTER — Ambulatory Visit

## 2024-07-12 ENCOUNTER — Ambulatory Visit: Admitting: Family Medicine

## 2024-07-25 DIAGNOSIS — Z9621 Cochlear implant status: Secondary | ICD-10-CM | POA: Diagnosis not present

## 2024-07-25 DIAGNOSIS — H905 Unspecified sensorineural hearing loss: Secondary | ICD-10-CM | POA: Diagnosis not present

## 2024-07-25 DIAGNOSIS — Z45321 Encounter for adjustment and management of cochlear device: Secondary | ICD-10-CM | POA: Diagnosis not present

## 2024-08-07 DIAGNOSIS — J3489 Other specified disorders of nose and nasal sinuses: Secondary | ICD-10-CM | POA: Diagnosis not present

## 2024-09-16 ENCOUNTER — Other Ambulatory Visit: Payer: Self-pay | Admitting: Adult Health

## 2024-09-20 ENCOUNTER — Ambulatory Visit: Admitting: Neurology

## 2024-12-03 ENCOUNTER — Other Ambulatory Visit: Payer: Self-pay | Admitting: Adult Health

## 2024-12-07 ENCOUNTER — Ambulatory Visit

## 2024-12-07 VITALS — BP 120/60 | HR 64 | Temp 97.7°F | Ht 68.0 in | Wt 139.6 lb

## 2024-12-07 DIAGNOSIS — Z0000 Encounter for general adult medical examination without abnormal findings: Secondary | ICD-10-CM | POA: Diagnosis not present

## 2024-12-07 NOTE — Progress Notes (Signed)
 "  No chief complaint on file.    Subjective:   Michael Miranda is a 84 y.o. male who presents for a Medicare Annual Wellness Visit.  Visit info / Clinical Intake: Medicare Wellness Visit Type:: Subsequent Annual Wellness Visit Persons participating in visit and providing information:: patient Medicare Wellness Visit Mode:: In-person (required for WTM) Interpreter Needed?: No Pre-visit prep was completed: yes AWV questionnaire completed by patient prior to visit?: yes Date:: 12/07/24 Living arrangements:: lives with spouse/significant other Patient's Overall Health Status Rating: good Typical amount of pain: none Does pain affect daily life?: no Are you currently prescribed opioids?: no  Dietary Habits and Nutritional Risks How many meals a day?: 3 Eats fruit and vegetables daily?: yes Most meals are obtained by: preparing own meals In the last 2 weeks, have you had any of the following?: none Diabetic:: no  Functional Status Activities of Daily Living (to include ambulation/medication): Independent Ambulation: Independent with device- listed below Home Assistive Devices/Equipment: Eyeglasses; Other (Comment) (Ear Implants) Medication Administration: Independent Home Management (perform basic housework or laundry): Independent Manage your own finances?: yes Primary transportation is: family / friends Concerns about vision?: no *vision screening is required for WTM* Concerns about hearing?: (!) yes Uses hearing aids?: (!) yes Hear whispered voice?: yes  Fall Screening Falls in the past year?: 0 Number of falls in past year: 0 Was there an injury with Fall?: 0 Fall Risk Category Calculator: 0 Patient Fall Risk Level: Low Fall Risk  Fall Risk Patient at Risk for Falls Due to: No Fall Risks Fall risk Follow up: Falls evaluation completed  Home and Transportation Safety: All rugs have non-skid backing?: yes All stairs or steps have railings?: N/A, no stairs Grab  bars in the bathtub or shower?: yes Have non-skid surface in bathtub or shower?: (!) no Good home lighting?: yes Regular seat belt use?: yes Hospital stays in the last year:: no  Cognitive Assessment Difficulty concentrating, remembering, or making decisions? : yes Will 6CIT or Mini Cog be Completed: yes What year is it?: 4 points What month is it?: 3 points Give patient an address phrase to remember (5 components): [address] [place]  About what time is it?: 0 points Count backwards from 20 to 1: 2 points Say the months of the year in reverse: 4 points Repeat the address phrase from earlier: 10 points 6 CIT Score: 23 points  Advance Directives (For Healthcare) Does Patient Have a Medical Advance Directive?: Yes Does patient want to make changes to medical advance directive?: No - Patient declined Type of Advance Directive: Healthcare Power of [place]; Living will Copy of Healthcare Power of Attorney in Chart?: No - copy requested Copy of Living Will in Chart?: No - copy requested  Reviewed/Updated  Reviewed/Updated: Reviewed All (Medical, Surgical, Family, Medications, Allergies, Care Teams, Patient Goals)    Allergies (verified) Patient has no known allergies.   Current Medications (verified) Outpatient Encounter Medications as of 12/07/2024  Medication Sig   donepezil  (ARICEPT ) 5 MG tablet Take 1 tablet (5 mg total) by mouth at bedtime.   rosuvastatin  (CRESTOR ) 10 MG tablet TAKE 1 TABLET(10 MG) BY MOUTH DAILY   No facility-administered encounter medications on file as of 12/07/2024.    History: Past Medical History:  Diagnosis Date   Alcohol abuse    10 years ago    Arthritis    Depression    Hyperlipidemia    Hypertension    Kidney stones    20 years ago  Past Surgical History:  Procedure Laterality Date   COCHLEAR IMPLANT     bilateral   HERNIA REPAIR     Family History  Problem Relation Age of Onset   Heart attack Brother    Coronary artery  disease Brother    Cancer Father        lung   Dementia Mother    Social History   Occupational History   Not on file  Tobacco Use   Smoking status: Never   Smokeless tobacco: Never  Vaping Use   Vaping status: Never Used  Substance and Sexual Activity   Alcohol use: Not Currently    Comment: no drinking in 6 months   Drug use: No   Sexual activity: Not on file   Tobacco Counseling Counseling given: No  SDOH Screenings   Food Insecurity: No Food Insecurity (12/07/2024)  Housing: Unknown (12/07/2024)  Transportation Needs: No Transportation Needs (12/07/2024)  Utilities: Not At Risk (12/07/2024)  Depression (PHQ2-9): Low Risk (12/07/2024)  Financial Resource Strain: Low Risk (04/27/2022)  Physical Activity: Inactive (12/07/2024)  Social Connections: Socially Integrated (12/07/2024)  Stress: Stress Concern Present (12/07/2024)  Tobacco Use: Low Risk (12/07/2024)  Health Literacy: Adequate Health Literacy (12/07/2024)   See flowsheets for full screening details  Depression Screen PHQ 2 & 9 Depression Scale- Over the past 2 weeks, how often have you been bothered by any of the following problems? Little interest or pleasure in doing things: 1 Feeling down, depressed, or hopeless (PHQ Adolescent also includes...irritable): 0 PHQ-2 Total Score: 1     Goals Addressed               This Visit's Progress     Remain active (pt-stated)               Objective:    Today's Vitals   12/07/24 1231  BP: 120/60  Pulse: 64  Temp: 97.7 F (36.5 C)  TempSrc: Oral  SpO2: 97%  Weight: 139 lb 9.6 oz (63.3 kg)  Height: 5' 8 (1.727 m)   Body mass index is 21.23 kg/m.  Hearing/Vision screen Hearing Screening - Comments:: Wears Hearing Implants Vision Screening - Comments:: Wears rx glasses - up to date with routine eye exams with  Deferred Immunizations and Health Maintenance Health Maintenance  Topic Date Due   Zoster Vaccines- Shingrix (1 of 2) 10/09/1960   Influenza Vaccine   07/06/2024   COVID-19 Vaccine (5 - 2025-26 season) 08/06/2024   DTaP/Tdap/Td (2 - Tdap) 11/27/2024   Medicare Annual Wellness (AWV)  12/07/2025   Pneumococcal Vaccine: 50+ Years  Completed   Meningococcal B Vaccine  Aged Out   Hepatitis C Screening  Discontinued        Assessment/Plan:  This is a routine wellness examination for [place].  Patient Care Team: Merna Huxley, NP as PCP - General (Family Medicine) Wonda Sharper, MD as PCP - Cardiology (Cardiology)  I have personally reviewed and noted the following in the patients chart:   Medical and social history Use of alcohol, tobacco or illicit drugs  Current medications and supplements including opioid prescriptions. Functional ability and status Nutritional status Physical activity Advanced directives List of other physicians Hospitalizations, surgeries, and ER visits in previous 12 months Vitals Screenings to include cognitive, depression, and falls Referrals and appointments  No orders of the defined types were placed in this encounter.  In addition, I have reviewed and discussed with patient certain preventive protocols, quality metrics, and best practice recommendations. A written personalized care  plan for preventive services as well as general preventive health recommendations were provided to patient.   Rojelio LELON Blush, LPN   07/12/7972   Return in 53 weeks (on 12/13/2025).  After Visit Summary: (In Person-Printed) AVS printed and given to the patient  Nurse Notes: Vaccines pending verification "

## 2024-12-07 NOTE — Patient Instructions (Addendum)
 [name],  Thank you for taking the time for your Medicare Wellness Visit. I appreciate your continued commitment to your health goals. Please review the care plan we discussed, and feel free to reach out if I can assist you further.  Please note that Annual Wellness Visits do not include a physical exam. Some assessments may be limited, especially if the visit was conducted virtually. If needed, we may recommend an in-person follow-up with your provider.  Ongoing Care Seeing your primary care provider every 3 to 6 months helps us  monitor your health and provide consistent, personalized care.   Referrals If a referral was made during today's visit and you haven't received any updates within two weeks, please contact the referred provider directly to check on the status.  Recommended Screenings:  Health Maintenance  Topic Date Due   Zoster (Shingles) Vaccine (1 of 2) [birth year]09/1960   Flu Shot  07/06/2024   COVID-19 Vaccine (5 - 2025-26 season) 08/06/2024   DTaP/Tdap/Td vaccine (2 - Tdap) 11/27/2024   Medicare Annual Wellness Visit  12/07/2025   Pneumococcal Vaccine for age over 59  Completed   Meningitis B Vaccine  Aged Out   Hepatitis C Screening  Discontinued       12/07/2024   12:38 PM  Advanced Directives  Does Patient Have a Medical Advance Directive? Yes  Type of Estate Agent of [place];Living will  Does patient want to make changes to medical advance directive? No - Patient declined  Copy of Healthcare Power of Attorney in Chart? No - copy requested    Vision: Annual vision screenings are recommended for early detection of glaucoma, cataracts, and diabetic retinopathy. These exams can also reveal signs of chronic conditions such as diabetes and high blood pressure.  Dental: Annual dental screenings help detect early signs of oral cancer, gum disease, and other conditions linked to overall health, including heart disease and diabetes.  Please see the  attached documents for additional preventive care recommendations.

## 2025-12-13 ENCOUNTER — Ambulatory Visit
